# Patient Record
Sex: Female | Born: 1935 | Race: White | Hispanic: No | Smoking: Never smoker
Health system: Southern US, Community
[De-identification: ages and names within clinical notes are randomized; demographics above are authoritative.]

## PROBLEM LIST (undated history)

## (undated) DIAGNOSIS — I10 Essential (primary) hypertension: Secondary | ICD-10-CM

## (undated) DIAGNOSIS — K219 Gastro-esophageal reflux disease without esophagitis: Secondary | ICD-10-CM

## (undated) DIAGNOSIS — D649 Anemia, unspecified: Secondary | ICD-10-CM

## (undated) DIAGNOSIS — M1990 Unspecified osteoarthritis, unspecified site: Secondary | ICD-10-CM

---

## 2003-06-27 ENCOUNTER — Emergency Department (HOSPITAL_COMMUNITY): Admission: EM | Admit: 2003-06-27 | Discharge: 2003-06-27 | Payer: Self-pay | Admitting: Emergency Medicine

## 2008-06-25 ENCOUNTER — Emergency Department (HOSPITAL_BASED_OUTPATIENT_CLINIC_OR_DEPARTMENT_OTHER): Admission: EM | Admit: 2008-06-25 | Discharge: 2008-06-25 | Payer: Self-pay | Admitting: Emergency Medicine

## 2008-06-25 ENCOUNTER — Ambulatory Visit: Payer: Self-pay | Admitting: Interventional Radiology

## 2008-07-26 ENCOUNTER — Inpatient Hospital Stay (HOSPITAL_COMMUNITY): Admission: EM | Admit: 2008-07-26 | Discharge: 2008-08-01 | Payer: Self-pay | Admitting: Emergency Medicine

## 2008-07-29 ENCOUNTER — Encounter (INDEPENDENT_AMBULATORY_CARE_PROVIDER_SITE_OTHER): Payer: Self-pay | Admitting: General Surgery

## 2008-08-08 ENCOUNTER — Encounter: Payer: Self-pay | Admitting: Emergency Medicine

## 2008-08-08 ENCOUNTER — Inpatient Hospital Stay (HOSPITAL_COMMUNITY): Admission: EM | Admit: 2008-08-08 | Discharge: 2008-08-11 | Payer: Self-pay | Admitting: Internal Medicine

## 2008-08-08 ENCOUNTER — Ambulatory Visit: Payer: Self-pay | Admitting: Diagnostic Radiology

## 2008-08-10 ENCOUNTER — Ambulatory Visit: Payer: Self-pay | Admitting: Gastroenterology

## 2009-11-26 ENCOUNTER — Observation Stay (HOSPITAL_COMMUNITY): Admission: EM | Admit: 2009-11-26 | Discharge: 2009-11-28 | Payer: Self-pay | Admitting: Emergency Medicine

## 2010-06-19 LAB — DIFFERENTIAL
Basophils Absolute: 0 10*3/uL (ref 0.0–0.1)
Eosinophils Absolute: 0.3 10*3/uL (ref 0.0–0.7)
Lymphocytes Relative: 7 % — ABNORMAL LOW (ref 12–46)
Lymphs Abs: 0.8 10*3/uL (ref 0.7–4.0)
Neutrophils Relative %: 83 % — ABNORMAL HIGH (ref 43–77)

## 2010-06-19 LAB — CK TOTAL AND CKMB (NOT AT ARMC)
CK, MB: 1.5 ng/mL (ref 0.3–4.0)
CK, MB: 3.6 ng/mL (ref 0.3–4.0)
Relative Index: INVALID (ref 0.0–2.5)
Total CK: 22 U/L (ref 7–177)

## 2010-06-19 LAB — TROPONIN I: Troponin I: 0.02 ng/mL (ref 0.00–0.06)

## 2010-06-19 LAB — BASIC METABOLIC PANEL
BUN: 15 mg/dL (ref 6–23)
Calcium: 8.6 mg/dL (ref 8.4–10.5)
GFR calc non Af Amer: 58 mL/min — ABNORMAL LOW (ref >60)
Potassium: 3.7 mEq/L (ref 3.5–5.1)
Sodium: 138 mEq/L (ref 135–145)

## 2010-06-19 LAB — CARDIAC PANEL(CRET KIN+CKTOT+MB+TROPI)
CK, MB: 1.8 ng/mL (ref 0.3–4.0)
Relative Index: INVALID (ref 0.0–2.5)
Total CK: 25 U/L (ref 7–177)
Troponin I: 0.04 ng/mL (ref 0.00–0.06)

## 2010-06-19 LAB — CBC
MCH: 29.1 pg (ref 26.0–34.0)
MCHC: 32.5 g/dL (ref 30.0–36.0)
MCHC: 32.7 g/dL (ref 30.0–36.0)
MCV: 89.6 fL (ref 78.0–100.0)
Platelets: 174 10*3/uL (ref 150–400)
Platelets: 185 10*3/uL (ref 150–400)
RDW: 13.4 % (ref 11.5–15.5)
WBC: 8.7 10*3/uL (ref 4.0–10.5)

## 2010-06-19 LAB — MAGNESIUM: Magnesium: 1.9 mg/dL (ref 1.5–2.5)

## 2010-06-19 LAB — COMPREHENSIVE METABOLIC PANEL
ALT: 14 U/L (ref 0–35)
CO2: 26 mEq/L (ref 19–32)
Calcium: 8.4 mg/dL (ref 8.4–10.5)
Chloride: 105 mEq/L (ref 96–112)
Creatinine, Ser: 1.04 mg/dL (ref 0.4–1.2)
GFR calc non Af Amer: 49 mL/min — ABNORMAL LOW (ref >60)
Glucose, Bld: 114 mg/dL — ABNORMAL HIGH (ref 70–99)
Total Bilirubin: 0.9 mg/dL (ref 0.3–1.2)

## 2010-06-19 LAB — LIPASE, BLOOD: Lipase: 21 U/L (ref 11–59)

## 2010-06-19 LAB — HEMOGLOBIN A1C: Hgb A1c MFr Bld: 5.5 % (ref <5.7)

## 2010-06-19 LAB — LIPID PANEL: VLDL: 11 mg/dL (ref 0–40)

## 2010-06-19 LAB — PROTIME-INR: Prothrombin Time: 14.3 seconds (ref 11.6–15.2)

## 2010-06-19 LAB — APTT: aPTT: 43 seconds — ABNORMAL HIGH (ref 24–37)

## 2010-07-14 LAB — CBC
HCT: 33.1 % — ABNORMAL LOW (ref 36.0–46.0)
HCT: 37.2 % (ref 36.0–46.0)
Hemoglobin: 11.6 g/dL — ABNORMAL LOW (ref 12.0–15.0)
Hemoglobin: 12.7 g/dL (ref 12.0–15.0)
Hemoglobin: 12.8 g/dL (ref 12.0–15.0)
MCHC: 34.5 g/dL (ref 30.0–36.0)
MCV: 88.9 fL (ref 78.0–100.0)
Platelets: 316 10*3/uL (ref 150–400)
Platelets: 328 10*3/uL (ref 150–400)
RBC: 4.2 MIL/uL (ref 3.87–5.11)
RDW: 12.9 % (ref 11.5–15.5)
RDW: 12.9 % (ref 11.5–15.5)
RDW: 13.4 % (ref 11.5–15.5)
WBC: 5.6 10*3/uL (ref 4.0–10.5)
WBC: 9.7 10*3/uL (ref 4.0–10.5)

## 2010-07-14 LAB — CULTURE, BLOOD (ROUTINE X 2)
Culture: NO GROWTH
Culture: NO GROWTH

## 2010-07-14 LAB — COMPREHENSIVE METABOLIC PANEL
ALT: 113 U/L — ABNORMAL HIGH (ref 0–35)
ALT: 71 U/L — ABNORMAL HIGH (ref 0–35)
ALT: 61 U/L — ABNORMAL HIGH (ref 0–35)
AST: 163 U/L — ABNORMAL HIGH (ref 0–37)
Albumin: 2.8 g/dL — ABNORMAL LOW (ref 3.5–5.2)
Albumin: 3.3 g/dL — ABNORMAL LOW (ref 3.5–5.2)
Alkaline Phosphatase: 168 U/L — ABNORMAL HIGH (ref 39–117)
Alkaline Phosphatase: 206 U/L — ABNORMAL HIGH (ref 39–117)
Alkaline Phosphatase: 209 U/L — ABNORMAL HIGH (ref 39–117)
BUN: 27 mg/dL — ABNORMAL HIGH (ref 6–23)
CO2: 28 mEq/L (ref 19–32)
Calcium: 9.2 mg/dL (ref 8.4–10.5)
Chloride: 107 mEq/L (ref 96–112)
Chloride: 103 mEq/L (ref 96–112)
GFR calc Af Amer: >60 mL/min (ref >60)
Glucose, Bld: 94 mg/dL (ref 70–99)
Glucose, Bld: 102 mg/dL — ABNORMAL HIGH (ref 70–99)
Potassium: 3.6 mEq/L (ref 3.5–5.1)
Potassium: 3.8 mEq/L (ref 3.5–5.1)
Potassium: 4.5 mEq/L (ref 3.5–5.1)
Sodium: 140 mEq/L (ref 135–145)
Sodium: 141 mEq/L (ref 135–145)
Sodium: 139 mEq/L (ref 135–145)
Total Bilirubin: 0.6 mg/dL (ref 0.3–1.2)
Total Bilirubin: 0.7 mg/dL (ref 0.3–1.2)
Total Protein: 5.4 g/dL — ABNORMAL LOW (ref 6.0–8.3)
Total Protein: 6.2 g/dL (ref 6.0–8.3)

## 2010-07-14 LAB — URINALYSIS, ROUTINE W REFLEX MICROSCOPIC
Glucose, UA: NEGATIVE mg/dL
Hgb urine dipstick: NEGATIVE
Protein, ur: NEGATIVE mg/dL
pH: 5 (ref 5.0–8.0)

## 2010-07-14 LAB — URINE MICROSCOPIC-ADD ON

## 2010-07-14 LAB — URINALYSIS, MICROSCOPIC ONLY
Bilirubin Urine: NEGATIVE
Ketones, ur: NEGATIVE mg/dL
Nitrite: NEGATIVE
Protein, ur: NEGATIVE mg/dL
pH: 7.5 (ref 5.0–8.0)

## 2010-07-14 LAB — HEPATIC FUNCTION PANEL
AST: 35 U/L (ref 0–37)
Albumin: 2.8 g/dL — ABNORMAL LOW (ref 3.5–5.2)
Alkaline Phosphatase: 149 U/L — ABNORMAL HIGH (ref 39–117)
Bilirubin, Direct: <0.1 mg/dL (ref 0.0–0.3)
Total Bilirubin: 0.6 mg/dL (ref 0.3–1.2)

## 2010-07-14 LAB — DIFFERENTIAL
Basophils Absolute: 0 10*3/uL (ref 0.0–0.1)
Basophils Relative: 0 % (ref 0–1)
Eosinophils Absolute: 0.3 10*3/uL (ref 0.0–0.7)
Monocytes Absolute: 0.5 10*3/uL (ref 0.1–1.0)
Neutro Abs: 8 10*3/uL — ABNORMAL HIGH (ref 1.7–7.7)
Neutrophils Relative %: 82 % — ABNORMAL HIGH (ref 43–77)

## 2010-07-14 LAB — MAGNESIUM: Magnesium: 1.9 mg/dL (ref 1.5–2.5)

## 2010-07-14 LAB — URINE CULTURE: Culture: NO GROWTH

## 2010-07-14 LAB — LIPASE, BLOOD: Lipase: 67 U/L — ABNORMAL HIGH (ref 11–59)

## 2010-07-14 LAB — LIPID PANEL
Cholesterol: 153 mg/dL (ref 0–200)
LDL Cholesterol: 91 mg/dL (ref 0–99)

## 2010-07-14 LAB — POCT CARDIAC MARKERS: Myoglobin, poc: 86.6 ng/mL (ref 12–200)

## 2010-07-15 LAB — CBC
HCT: 34 % — ABNORMAL LOW (ref 36.0–46.0)
HCT: 37.3 % (ref 36.0–46.0)
Hemoglobin: 11.9 g/dL — ABNORMAL LOW (ref 12.0–15.0)
Hemoglobin: 12 g/dL (ref 12.0–15.0)
Hemoglobin: 12.8 g/dL (ref 12.0–15.0)
Hemoglobin: 13 g/dL (ref 12.0–15.0)
Hemoglobin: 13.6 g/dL (ref 12.0–15.0)
MCHC: 34.1 g/dL (ref 30.0–36.0)
MCHC: 34.2 g/dL (ref 30.0–36.0)
MCHC: 34.8 g/dL (ref 30.0–36.0)
MCHC: 35.1 g/dL (ref 30.0–36.0)
MCHC: 35.2 g/dL (ref 30.0–36.0)
MCV: 88.8 fL (ref 78.0–100.0)
MCV: 89.8 fL (ref 78.0–100.0)
Platelets: 222 10*3/uL (ref 150–400)
Platelets: 236 10*3/uL (ref 150–400)
RBC: 3.85 MIL/uL — ABNORMAL LOW (ref 3.87–5.11)
RBC: 4.23 MIL/uL (ref 3.87–5.11)
RDW: 13.3 % (ref 11.5–15.5)
RDW: 13.3 % (ref 11.5–15.5)
RDW: 13.4 % (ref 11.5–15.5)
RDW: 13.8 % (ref 11.5–15.5)
WBC: 7.6 10*3/uL (ref 4.0–10.5)

## 2010-07-15 LAB — COMPREHENSIVE METABOLIC PANEL
ALT: 144 U/L — ABNORMAL HIGH (ref 0–35)
ALT: 49 U/L — ABNORMAL HIGH (ref 0–35)
ALT: 55 U/L — ABNORMAL HIGH (ref 0–35)
AST: 35 U/L (ref 0–37)
AST: 87 U/L — ABNORMAL HIGH (ref 0–37)
Albumin: 2.7 g/dL — ABNORMAL LOW (ref 3.5–5.2)
Albumin: 3.5 g/dL (ref 3.5–5.2)
Alkaline Phosphatase: 115 U/L (ref 39–117)
Alkaline Phosphatase: 151 U/L — ABNORMAL HIGH (ref 39–117)
Alkaline Phosphatase: 160 U/L — ABNORMAL HIGH (ref 39–117)
BUN: 11 mg/dL (ref 6–23)
BUN: 6 mg/dL (ref 6–23)
BUN: 7 mg/dL (ref 6–23)
CO2: 28 mEq/L (ref 19–32)
CO2: 28 mEq/L (ref 19–32)
Calcium: 8.5 mg/dL (ref 8.4–10.5)
Calcium: 8.8 mg/dL (ref 8.4–10.5)
Calcium: 8.8 mg/dL (ref 8.4–10.5)
Calcium: 9 mg/dL (ref 8.4–10.5)
Chloride: 104 mEq/L (ref 96–112)
Chloride: 108 mEq/L (ref 96–112)
Creatinine, Ser: 0.75 mg/dL (ref 0.4–1.2)
Creatinine, Ser: 0.78 mg/dL (ref 0.4–1.2)
GFR calc Af Amer: >60 mL/min (ref >60)
GFR calc Af Amer: >60 mL/min (ref >60)
GFR calc non Af Amer: >60 mL/min (ref >60)
GFR calc non Af Amer: >60 mL/min (ref >60)
Glucose, Bld: 108 mg/dL — ABNORMAL HIGH (ref 70–99)
Glucose, Bld: 118 mg/dL — ABNORMAL HIGH (ref 70–99)
Glucose, Bld: 129 mg/dL — ABNORMAL HIGH (ref 70–99)
Glucose, Bld: 86 mg/dL (ref 70–99)
Glucose, Bld: 87 mg/dL (ref 70–99)
Potassium: 3.5 mEq/L (ref 3.5–5.1)
Potassium: 3.6 mEq/L (ref 3.5–5.1)
Potassium: 4 mEq/L (ref 3.5–5.1)
Sodium: 138 mEq/L (ref 135–145)
Sodium: 139 mEq/L (ref 135–145)
Sodium: 140 mEq/L (ref 135–145)
Sodium: 141 mEq/L (ref 135–145)
Total Bilirubin: 0.9 mg/dL (ref 0.3–1.2)
Total Protein: 5.5 g/dL — ABNORMAL LOW (ref 6.0–8.3)
Total Protein: 5.6 g/dL — ABNORMAL LOW (ref 6.0–8.3)
Total Protein: 6.1 g/dL (ref 6.0–8.3)
Total Protein: 6.1 g/dL (ref 6.0–8.3)

## 2010-07-15 LAB — HEPATIC FUNCTION PANEL
AST: 28 U/L (ref 0–37)
Albumin: 2.6 g/dL — ABNORMAL LOW (ref 3.5–5.2)
Alkaline Phosphatase: 116 U/L (ref 39–117)
Total Bilirubin: 1 mg/dL (ref 0.3–1.2)
Total Protein: 5.2 g/dL — ABNORMAL LOW (ref 6.0–8.3)

## 2010-07-15 LAB — BASIC METABOLIC PANEL
CO2: 23 mEq/L (ref 19–32)
Chloride: 109 mEq/L (ref 96–112)
GFR calc Af Amer: >60 mL/min (ref >60)
Potassium: 3.3 mEq/L — ABNORMAL LOW (ref 3.5–5.1)
Sodium: 139 mEq/L (ref 135–145)

## 2010-07-15 LAB — CULTURE, BLOOD (ROUTINE X 2)

## 2010-07-15 LAB — LIPID PANEL
Cholesterol: 144 mg/dL (ref 0–200)
LDL Cholesterol: 81 mg/dL (ref 0–99)
VLDL: 13 mg/dL (ref 0–40)

## 2010-07-15 LAB — POCT CARDIAC MARKERS
CKMB, poc: 1.2 ng/mL (ref 1.0–8.0)
Myoglobin, poc: 85 ng/mL (ref 12–200)

## 2010-07-15 LAB — AMYLASE: Amylase: 135 U/L — ABNORMAL HIGH (ref 27–131)

## 2010-07-15 LAB — DIFFERENTIAL
Basophils Relative: 0 % (ref 0–1)
Eosinophils Absolute: 0.2 10*3/uL (ref 0.0–0.7)
Lymphs Abs: 0.5 10*3/uL — ABNORMAL LOW (ref 0.7–4.0)
Monocytes Absolute: 0.8 10*3/uL (ref 0.1–1.0)
Monocytes Relative: 7 % (ref 3–12)
Neutrophils Relative %: 87 % — ABNORMAL HIGH (ref 43–77)

## 2010-07-15 LAB — C-REACTIVE PROTEIN: CRP: 17 mg/dL — ABNORMAL HIGH (ref <0.6)

## 2010-07-15 LAB — URINALYSIS, ROUTINE W REFLEX MICROSCOPIC
Bilirubin Urine: NEGATIVE
Ketones, ur: NEGATIVE mg/dL
Nitrite: NEGATIVE
Protein, ur: NEGATIVE mg/dL
Urobilinogen, UA: 1 mg/dL (ref 0.0–1.0)
pH: 5.5 (ref 5.0–8.0)

## 2010-07-15 LAB — ALT: ALT: 57 U/L — ABNORMAL HIGH (ref 0–35)

## 2010-07-15 LAB — GLUCOSE, CAPILLARY: Glucose-Capillary: 119 mg/dL — ABNORMAL HIGH (ref 70–99)

## 2010-07-15 LAB — PROTIME-INR
INR: 1.1 (ref 0.00–1.49)
Prothrombin Time: 14.2 seconds (ref 11.6–15.2)

## 2010-07-15 LAB — LIPASE, BLOOD
Lipase: 34 U/L (ref 11–59)
Lipase: 675 U/L — ABNORMAL HIGH (ref 11–59)

## 2010-07-15 LAB — CK TOTAL AND CKMB (NOT AT ARMC)
Relative Index: INVALID (ref 0.0–2.5)
Total CK: 36 U/L (ref 7–177)

## 2010-07-15 LAB — SEDIMENTATION RATE: Sed Rate: 30 mm/hr — ABNORMAL HIGH (ref 0–22)

## 2010-07-16 LAB — URINALYSIS, ROUTINE W REFLEX MICROSCOPIC
Nitrite: NEGATIVE
Protein, ur: NEGATIVE mg/dL
Urobilinogen, UA: 1 mg/dL (ref 0.0–1.0)

## 2010-07-16 LAB — COMPREHENSIVE METABOLIC PANEL
ALT: 28 U/L (ref 0–35)
Alkaline Phosphatase: 138 U/L — ABNORMAL HIGH (ref 39–117)
BUN: 27 mg/dL — ABNORMAL HIGH (ref 6–23)
CO2: 28 mEq/L (ref 19–32)
GFR calc non Af Amer: >60 mL/min (ref >60)
Glucose, Bld: 101 mg/dL — ABNORMAL HIGH (ref 70–99)
Potassium: 4.4 mEq/L (ref 3.5–5.1)
Sodium: 141 mEq/L (ref 135–145)

## 2010-07-16 LAB — CBC
HCT: 42.6 % (ref 36.0–46.0)
Hemoglobin: 14.1 g/dL (ref 12.0–15.0)
MCHC: 33 g/dL (ref 30.0–36.0)
RBC: 4.69 MIL/uL (ref 3.87–5.11)

## 2010-07-16 LAB — POCT CARDIAC MARKERS: Troponin i, poc: <0.05 ng/mL (ref 0.00–0.09)

## 2010-07-16 LAB — DIFFERENTIAL
Basophils Absolute: 0 10*3/uL (ref 0.0–0.1)
Basophils Relative: 1 % (ref 0–1)
Eosinophils Absolute: 0.3 10*3/uL (ref 0.0–0.7)
Neutrophils Relative %: 76 % (ref 43–77)

## 2010-07-16 LAB — URINE CULTURE: Culture: NO GROWTH

## 2010-07-16 LAB — URINE MICROSCOPIC-ADD ON

## 2010-08-18 NOTE — Discharge Summary (Signed)
Alexis Archer, STENCIL NO.:  [id number]   MEDICAL RECORD NO.:  [id number]          PATIENT TYPE:  INP   LOCATION:  1317                         FACILITY:  [facility]   PHYSICIAN:  Marcellus Scott, MD     DATE OF BIRTH:  04-Dec-1914   DATE OF ADMISSION:  08/08/2008  DATE OF DISCHARGE:  08/11/2008                               DISCHARGE SUMMARY   PRIMARY MEDICAL DOCTOR:  Dr. Karlene Einstein of [facility].   PRIMARY GASTROENTEROLOGIST:  Dr. Jeani Hawking.   DISCHARGE DIAGNOSIS:  1. Gallstone pancreatitis, status post endoscopic retrograde      cholangiopancreatography and gallstone extraction.  2. Encephalopathy/ altered mental status, resolved.  3. Urinary tract infection.  4. Anemia, stable.  5. Osteoarthritis.  6. Glaucoma.  7. Hypertension.  8. Gastroesophageal reflux disease.   DISCHARGE MEDICATIONS:  1. Os-Cal 500 mg p.o. t.i.d. with meals.  2. Ambien 5 mg p.o. q.h.s. p.r.n.  3. Calcitonin nasal spray 200 units, one spray nasally daily.  May      alternate nostrils.  4. Norvasc 5 mg p.o. daily.  5. Combigan 0.5% solution, one drop in each eye b.i.d.  6. Dulcolax 10 mg p.o. b.i.d.  7. Ativan 0.5 mg p.o.q.8h. p.r.n. for agitation.  8. Duragesic patch 12 mcg per hour transdermally q.72 hours.  9. Lumigan ophthalmic solution, 0.03%, one drop in each eye q.h.s.  10.Protonix 40 mg p.o. daily.  11.Percocet 5/325 mg  tablet, one to two tablets p.o. q.6h. p.r.n.  12.Beneprotein, one scoop p.o. t.i.d. between meals and beverage of      choice.  13.Ceftin 250 mg p.o. b.i.d. for three days.   DISCONTINUED MEDICATIONS:  1. Milk of magnesia.  2. Dulcolax suppository.  3. Fleet's enema.  4. Imodium.  5. Vicodin  6. A stress tablets with zinc.   PROCEDURE:  1. ERCP by Dr. Melvia Heaps on Aug 10, 2008.  Impression:  Lithotripsy      of common bile duct calculus with balloon extraction of the      fragments.  Good drainage on the last film.  2. CT of the  abdomen with contrast.  Impression:  Findings raise      possibility of common bile duct stone.  Post-surgical changes,      status post cholecystectomy.  3. X-rays of the abdomen.  Impression:  A tiny amount of free air      under the right hemidiaphragm, is probably related to recent      cholecystectomy and nonobstructive bowel gas pattern.   LABORATORY DATA:  hepatic panel today with total protein 5.3, albumin  2.8, AST 35, ALT 52, alkaline phosphatase 149, total bilirubin 0.6,  direct bilirubin less than 0.1, CBC with a hemoglobin of 12, hematocrit  34.7 white blood cells 6.1, platelets 328.  Urine cultures showed no  growth.  Urine culture was sent off the next day after antibiotics were  started.  Blood cultures x2 showed no growth to date.  Basic metabolic  panel yesterday was unremarkable, with BUN of 9, creatinine 0.8.  Urinalysis on admission had positive nitrites, small leukocytes,  many  bacteria and 7-10 white blood cells per high-power field.  The last  lipase was 67 and amylase 114, lipid panel unremarkable.  Lipase on  admission was 646.  Point of care cardiac markers were negative.   CONSULTATIONS:  GI, Dr. Jeani Hawking and Dr. Melvia Heaps.   HOSPITAL COURSE/DISPOSITION:  Please refer to the history and physical  note for initial admission details.  In summary, Alexis Archer is a  pleasant 77 year old Caucasian female patient, with recent laparoscopic  cholecystectomy, followed by two endoscopic retrograde  cholangiopancreatographies, osteoporosis, mild dementia, anxiety,  chronic low back pain, osteoarthritis, hypertension and gastroesophageal  reflux disease, who now represented with epigastric pain and was found  to have elevated lipase, suggestive of recurrent gallstone pancreatitis.   Problem #1 - Recurrent gallstones pancreatitis:  The patient was  admitted to the medical floor.  She was made n.p.o..  She was placed on  IV fluids.  GI was consulted.  They  repeated CT of the abdomen, which  revealed a common bile duct stone.  Subsequently the patient had an ERCP  yesterday, followed by a stone extraction.  Since then, the patient has  been asymptomatic of abdominal pain.  Her advancing diet has been  tolerated.  GI has reviewed her today and cleared her for discharge.  The patient is to follow up with Dr. Jeani Hawking as an outpatient.  The  facility is to call his office to make such appointments.   Problem #2 - Encephalopathy:  The patient has baseline mild dementia and  she was confused, which may have been secondary to her acute medical  illness of pancreatitis and urinary tract infection, and also the  perioperative medications given.  Today, however, her daughter indicates  that her mental status is back to baseline.   Problem #3 - The patient has received ceftriaxone in the hospital.  Will  complete a total week's course of antibiotics and then recommend  repeating a urinalysis.   Problem #4 - Anemia:  Which has been stable.   Problem #5 - Osteoarthritis:  The patient complains of some knee pains,  but the knees themselves have no acute findings.  This is likely  secondary to her arthritis.   The patient is a full code.   I have discussed her care with her daughter, Alexis Archer, and updated  all care.  The patient apparently is wheelchair-bound at the nursing  facility.  The patient at this time is stable for discharge, to return  to [employer], on a regular diet.   The time taken in coordinating this discharge is 45 minutes.      Marcellus Scott, MD  Electronically Signed     AH/MEDQ  D:  08/11/2008  T:  08/11/2008  Job:  [id number]   cc:   Jordan Hawks. Elnoria Howard, MD  Fax: [phone]   Karlene Einstein, M.D.  Fax: [phone]   Barbette Hair. Arlyce Dice, MD,FACG  [address]  [place]  [place] [address]

## 2010-08-18 NOTE — Consult Note (Signed)
NAMECHENEY, GOSCH NO.:  [id number]   MEDICAL RECORD NO.:  [id number]          PATIENT TYPE:  INP   LOCATION:  5159                         FACILITY:  [facility]   PHYSICIAN:  Ardeth Sportsman, MD     DATE OF BIRTH:  [birth year]   DATE OF CONSULTATION:  DATE OF DISCHARGE:                                 CONSULTATION   REASON FOR CONSULTATION:  Biliary pancreatitis.   HISTORY OF PRESENT ILLNESS:  Alexis Archer is a 89-year female patient who  resides at a skilled nursing facility.  According to her daughter, she  presented at this time to the ER with severe acute onset of an upper  abdominal pain.  The patient has been having episodic symptoms  consistent with biliary colics every 2 months and has been brought to  the ER.  This has been going on for about a year.  Only recently in the  past few months did they finally discovered the patient had gallstones  and has been trying to manage this with a low-fat diet.  Unfortunately,  the patient has continued to have abdominal pain and nausea and this  episode is the worse she has had.  On presentation, her abdomen was  quite tender in the right upper quadrant epigastric regions.  She had a  low-grade leukocytosis, elevated LFTs with an elevated alkaline  phosphatase, ALT, and mildly elevated total bilirubin.  Because of the  degree of her pain, a CT was opted for as initial study.  This did show  gallbladder wall thickening, distended gallbladder with a stone in the  gallbladder measuring almost 2 cm.  She also has a stone, 0.9 mm, wedged  in the mid common bile duct with biliary ductal dilatation.  This is  consistent with choledocholithiasis.  Internal Medicine is admitting the  patient and GI has been consulted for possible ERCP soon.   REVIEW OF SYSTEMS:  The patient unable to contribute due to short-term  memory issues.  Otherwise, as per the history of present illness.  Other  systems are negative or  noncontributory.   SOCIAL HISTORY:  She is a resident of skilled nursing facility at the  [facility].   FAMILY HISTORY:  Noncontributory.   PAST MEDICAL HISTORY:  1. Cholelithiasis and biliary colic, episodic.  2. Vertebral compression fractures with chronic back pain.  3. Degenerative joint disease.  4. Peptic ulcer disease and GERD.  5. Known hemorrhoids.  6. Recurrent UTIs.  7. Dementia and encephalopathy.   PAST SURGICAL HISTORY:  Appendectomy as a child.   ALLERGIES:  NKDA.   CURRENT MEDICATIONS AT THE NURSING FACILITY:  1. __________.  2. Ambien.  3. Atarax.  4. Calcitonin.  5. Colace.  6. Combigan.  7. Dulcolax.  8. Duragesic.  9. Lorazepam.  10.Lumigan.  11.Multiple vitamins.  12.Oyster Shell with Vitamin D.  13.Pepcid.  14.Vicodin.  15.Vitamin C.   PHYSICAL EXAMINATION:  GENERAL:  Pleasant female patient who was not  readily reporting pain until her examination.  VITAL SIGNS:  Temperature 98.5, BP 168/80, pulse 77 and regular,  respirations are 18.  PSYCH:  The patient is alert, oriented to name.  She has short-term  memory deficits.  She was unable to recall her recent history regarding  her abdominal symptoms.  She is verbal though and makes eye contact, and  is otherwise appropriate.  NEUROLOGIC:  Cranial nerves II through XII are grossly intact.  She is  moving all extremities x4 without any gross motor neurological deficits.  EYES:  Sclerae are nonicteric.  EARS, NOSE, AND THROAT:  Ears are symmetrical.  No otorrhea.  Nose is  midline.  No rhinorrhea.  Oral mucous membranes are pink and moist.  CHEST:  Bilateral lung sounds are clear to auscultation.  Respiratory  effort is nonlabored.  CARDIOVASCULAR:  Heart sounds.  S1, S2.  No rubs, murmurs, thrills.  No  gallops.  No JVD.  No peripheral edema.  EXTREMITIES:  Slightly tender to the touch bilaterally.  The daughter  says this is chronic.  Extremities are symmetrical without cyanosis or   clubbing.  ABDOMEN:  Obese.  Tender in the right upper quadrant epigastrium with  guarding.  No rebounding.  Bowel sounds are present, but diminished.  She has a well-healed scar in her right lower quadrant consistent with  prior appendectomy.  No hernia here.   LABORATORY DATA:  White count 12,000, hemoglobin 13.6, platelets  236,000, neutrophils 87%.  Sodium 141, potassium 3.5, CO2 of 25, glucose  108, BUN 22, creatinine 0.88, lipase 1926, total bilirubin 1.1, alkaline  phosphatase 160, AST 88, ALT 144.  Diagnostic CT of the abdomen and  pelvis as noted.   IMPRESSION:  1. Biliary colic and biliary pancreatitis, mild per CT report.  2. Choledocholithiasis.  GI consult pending for ERCP.  3. Dementia encephalopathy.  4. Leukocytosis.  5. Multiple medical problems, otherwise followed by Internal Medicine.   PLAN:  1. Agree with GI evaluation and ERCP when available.  2. Await for pancreatitis to resolve and cool down.  We will follow      enzymes serially.  Plan on probable laparoscopic cholecystectomy      this admission.  3. If ERCP unsuccessful and the patient remains with obstructive      problems related to the gallbladder and/or for whatever reason may      cannot proceed with surgical intervention this admission, may need      to consider percutaneous cholecystostomy tube.  Again we will      follow labs serially.  We will keep her n.p.o.      Allison L. Rolene Course      Ardeth Sportsman, MD  Electronically Signed    ALE/MEDQ  D:  07/26/2008  T:  07/27/2008  Job:  [id number]   cc:   Dr. Janice Norrie

## 2010-08-18 NOTE — H&P (Signed)
NAMEPARIS, HOHN NO.:  [id number]   MEDICAL RECORD NO.:  [id number]          PATIENT TYPE:  INP   LOCATION:  1317                         FACILITY:  [facility]   PHYSICIAN:  Manus Gunning, MD      DATE OF BIRTH:  November 26, 1914   DATE OF ADMISSION:  08/08/2008  DATE OF DISCHARGE:  08/01/2008                              HISTORY & PHYSICAL   CHIEF COMPLAINT:  Abdominal pain.   HISTORY OF PRESENT ILLNESS:  Mrs. Dondlinger is a 77 year old skilled  nursing facility resident of [facility].  She presented to  [facility] ED with complaints of abdominal/epigastric pain.  At that  time her lipase was elevated at 646. She was subsequently transferred to  our facility for pancreatitis.  The ED physician had discussed her case  with Dr. Loreta Ave who was covering for Dr. Elnoria Howard.  Apparently she had  undergone an ERCP and laparoscopic cholecystectomy for gallstone  pancreatitis on the July 31, 2008 and was discharged from [facility]  on April 29 in a stable and pain free condition.  Dr. Loreta Ave  recommended that the patient be admitted and kept n.p.o. for possible  redo ERCP tomorrow.  The patient herself is mildly demented.  She is  confused about where she is and about her previous admission, but she is  aware of what day it is and is otherwise very pleasant to speak to and  able to carry on normal conversation.  Unfortunately, in response to her  medical history most of her answers are I don't know.  She does  complain of epigastric discomfort, sharp in nature, 6/10.  No nausea, no  vomiting.  Tolerating p.o. intake with a normal appetite at home.  She  claims that the pain does occasionally travel to her back.  Denies any  diarrhea, constipation, dysuria or polyuria.  No hematuria or bright red  blood per rectum or melenic stools.  Of note, urinalysis at the ED  demonstrated urinary tract infection though she is afebrile with no  leukocytosis.  She denies headaches,  palpitations, PND, orthopnea, chest  pain, no syncope, presyncope, lightheadedness.  Has chronic pain  secondary to degenerative disk disease and osteoarthritis.  She is on  multiple pain medications and is essentially wheelchair bound.  She has  no shortness of breath, cough, expectoration, no odynophagia, dysphagia,  tinnitus, loss of consciousness, or blurring of vision.   PAST MEDICAL/SURGICAL HISTORY:  1. Gallstone pancreatitis, status post ERCP and sphincterectomy and      status post laparoscopic cholecystectomy.  2. Osteoporosis.  3. Chronic lower back pain secondary to compression fractures.  4. Osteoarthritis.  5. Anxiety.  6. Glaucoma.  7. Hypertension.  8. GERD.   SOCIAL HISTORY:  Lives in a skilled nursing facility.  No history of  tobacco, illicit drugs or alcohol.  Daughter is health care power-of-  attorney by the name of [name], phone number [phone].   FAMILY HISTORY:  Unable to be obtained from the patient secondary to  current condition.  Her answer, I don't know.   HOME MEDICATIONS:  1. Beneprotein two scoops twice a day.  2. Ambien 5 mg at bedtime.  3. Atarax 10 mg q.6h.  4. Calcitonin frequency and dose unknown.  5. Colace 100 mg twice a day.  6. Combigan to both eyes one drop twice a day.  7. Dulcolax as needed.  8. Duragesic 12.5 mcg q.72h.  9. Lumigan both eyes at bedtime.  10.Multivitamin one tablet daily.  11.Oyster shell plus D1 daily .  12.Pepcid 20 mg once daily.  13.Vicodin 5/500 mg every four hours.  14.Vitamin C 500 mg twice a day.   REVIEW OF SYSTEMS:  Essentially 14-point performed.  Pertinent positives  and negatives as described above.   PHYSICAL EXAMINATION:  VITAL SIGNS:  Temperature 97.9, heart rate 86,  respiratory rate 24, blood pressure 147/77. Oxygen saturation 94% on  room air.  GENERAL:  Well-nourished, well-developed, Caucasian female, elderly lady  lying in bed in no apparent distress.  HEENT:  Normocephalic,  atraumatic.  Moist oral mucosa.  No thrush or  erythema.  No post nasal drip.  Eyes anicteric.  Extraocular muscles  intact.  Pupils equal, round and reactive to light and accommodation.  NECK:  Supple.  Good range of motion.  No thyromegaly.  No carotid  bruits.  Neck veins appear to be normal.  CARDIOVASCULAR:  S1 and S2 normal.  Regular rate and rhythm.  No  murmurs, rubs or gallops.  RESPIRATORY:  Air entry bilaterally equal.  No rhonchi, rales or wheezes  appreciated.  ABDOMEN:  Positive epigastric tenderness.  No flank discoloration.  No  Cullens or Turner's sign.  Positive bowel sounds. No organomegaly  appreciated.  Nondistended.  EXTREMITIES:  No cyanosis, clubbing or edema.  Positive bilateral  dorsalis pedis.  Trace swelling in bilateral lower extremities secondary  to chronic OA and tender to touch which is chronic as well.  CNS:  Alert, oriented to person, disoriented to time and place, though  pleasant to speak to and able to carry on normal conversation.  Power,  sensation, reflexes bilaterally symmetric.  SKIN:  No breakdown, swelling, ulcerations or masses.  HEM/ONC:  No palpable lymphadenopathy. No ecchymosis.  Mild bruising.  No petechiae.   LABORATORY DATA:  WBC 9700, hemoglobin 12.8, hematocrit 37.2, platelet  count 370,000, polymorphs 139, potassium 4.5, chloride 103, carbon  dioxide 28, glucose 102, BUN 27, creatinine 0.9, calcium 9.3, total  protein 6.9, albumin 3.7, AST 115, ALT 61, total protein 6.9, albumin  3.7, calcium 9.3.  Lipase 646.  Troponin-I less than 0.05.  CK-MB 1,  myoglobin 86.6.  Urinalysis demonstrates positive nitrites, small  leukocyte esterase, wbc's 7-10, bacteria many.   ASSESSMENT/PLAN:  1. Pancreatitis.  Make the patient n.p.o.  Start normal saline 125      ml/hour and for pain control morphine 2 mg IV q.3h. p.r.n. pain.      Continue Duragesic patch.  Gastrointestinal has been consulted by      the emergency department.  Plan for  ERCP in the morning.  Will      check a CT scan of the abdomen.  2. Urinary tract infection.  Start Rocephin 1 g IV q.24h. Check urine      culture, sensitivity and blood cultures x2.  3. Chronic pain secondary to degenerative disk disease and compression      fractures as well as osteoarthritis.  Pain control as described      above.  4. Gastrointestinal and deep venous thrombosis prophylaxis.      Sequential compression  devices and Protonix 40      mg IV q.12h.   __________      Manus Gunning, MD  Electronically Signed     SP/MEDQ  D:  08/08/2008  T:  08/09/2008  Job:  [id number]

## 2010-08-18 NOTE — Op Note (Signed)
Alexis Archer, MARET NO.:  [id number]   MEDICAL RECORD NO.:  [id number]          PATIENT TYPE:  INP   LOCATION:  2604                         FACILITY:  [facility]   PHYSICIAN:  Alexis Archer, MDDATE OF BIRTH:  [birth year]   DATE OF PROCEDURE:  07/29/2008  DATE OF DISCHARGE:                               OPERATIVE REPORT   PREOPERATIVE DIAGNOSES:  1. Biliary pancreatitis.  2. Symptomatic cholelithiasis.   POSTOP DIAGNOSES:  1. Biliary pancreatitis.  2. Symptomatic cholelithiasis.   PROCEDURE:  Laparoscopic cholecystectomy and cholangiogram.   SURGEON:  Alexis Gosling, MD   ASSISTANT:  Alexis Sportsman, MD   ANESTHESIA:  General.   FINDINGS:  A retained 8-mm stone.   SPECIMENS:  Gallbladder and contents to pathology.   ESTIMATED BLOOD LOSS:  Minimal.   COMPLICATIONS:  None.   DRAINS:  None.   DISPOSITION:  To recovery room in stable condition.  Final disposition  is she is going to need an ERCP by Dr. Jeani Archer, which I have  discussed with him already.   INDICATIONS:  [name] is a 77 year old female with a past medical  history of hypertension and GERD who was admitted to the hospital over  the last several days with abdominal pain.  She has had this pain in the  past.  This time she was noted to have pancreatitis as well as a CT scan  and ultrasound showing a dilated common bile duct as well as  cholelithiasis and a stone present in common bile duct.  She underwent  an ERCP by Dr. Elnoria Archer on July 27, 2008, with a stone that was found was  crushed and removed.  A sphincterotomy was performed.  She was then  brought for a laparoscopic cholecystectomy.  I discussed this with her  but I also discussed with her daughter, Alexis Archer, the risks,  benefits, and indications of surgery, and she voiced her understanding.   DESCRIPTION OF PROCEDURE:  After informed consent was obtained from the  patient's daughter, she was taken to the  operating room.  She was  administered 1 g of intravenous cefoxitin.  Sequential compression  devices were placed on her lower extremity prior to operation.  She was  placed under general anesthesia without complication.  Her abdomen was  then prepped with ChloraPrep, 3 minutes were allowed to pass.  She was  then draped in standard sterile surgical fashion.  Surgical time-out was  then performed.   A 10-mm vertical incision was then made below her umbilicus and  dissection was carried out down to the level of fascia.  This was  incised sharply.  Peritoneum was entered bluntly.  A 0 Vicryl  pursestring suture was placed around the fascia.  A Hasson trocar was  then introduced.  The abdomen was insufflated to 15 mmHg pressure.  She  tolerated this well.  She was then placed in the reverse Trendelenburg  position.  Three further 5-mm trocars were placed in the epigastrium and  right side of the abdomen under direct vision without complication after  infiltration with local anesthetic.  The gallbladder was then retracted  cephalad and lateral.  The triangle of Calot was dissected and a  critical view of safety was obtained.  Her cystic duct and cystic artery  were clearly identified.  Due to her history of gallstone pancreatitis,  I placed a clip on the distal portion of the cystic duct. I introduced a  Cook catheter into the abdomen and then made a ductotomy  with scissors.  I then introduced the [facility] catheter into the duct and clipped this into  position.  Following this, the saline flowed well.  We then did a  cholangiogram that showed our position to be in the cystic duct, normal  filling of both sides of the liver, flow into the duodenum, but she did  appear to have about an 8-mm retained stone that we were unable to flush  out at that point.  The catheter was then removed.  Three clips were  placed on the duct.  The artery was similarly clipped and divided.  The  gallbladder was  removed from the liver bed without complication.  There  was some bleeding in the liver that was controlled with electrocautery.  Following this, irrigation was performed until this was clear.  Hemostasis was observed.  The umbilical port site was then tied down.  I  did put an additional figure-of-eight 0 Vicryl suture into this due to  her fascia being very thin.  This was completely closed.  Upon  completion, there was some bleeding from her epigastric port that was  controlled with electrocautery.  Prior to this, I did place an Endoloop  on the cystic duct stump in anticipation of an ERCP as well.  All the  ports were removed and after the abdomen was desufflated, these were  then sutured with 4-0 Monocryl in a subcuticular fashion.  Dermabond was  placed over the wounds.  She tolerated this well, was extubated in the  operating room, and transferred to recovery room in a stable condition.  I notified Dr. Elnoria Archer that she did have a retained stone.      Alexis Gosling, MD  Electronically Signed     MCW/MEDQ  D:  07/29/2008  T:  07/30/2008  Job:  [id number]   cc:   Alexis Archer. Alexis Howard, MD  Alexis Ramus, MD  Alexis Archer, M.D.

## 2010-08-18 NOTE — Discharge Summary (Signed)
[name], [name] NO.:  [id number]   MEDICAL RECORD NO.:  [id number]          PATIENT TYPE:  INP   LOCATION:  5025                         FACILITY:  [facility]   PHYSICIAN:  Monte Fantasia, MD  DATE OF BIRTH:  [birth year]   DATE OF ADMISSION:  07/26/2008  DATE OF DISCHARGE:  08/01/2008                               DISCHARGE SUMMARY   DISCHARGE DIAGNOSIS:  1. Gallstone pancreatitis.  2. Status post laparoscopic cholecystectomy.  3. Status post endoscopic retrograde cholangiopancreatography with      balloon passage.  4. Osteoporosis.   MEDICATIONS UPON DISCHARGE:  Alphagan 1 drop each eye b.i.d.,  Timolol 1 drop each eye b.i.d.,  Ambien 5 mg p.o. q.h.s. p.r.n. insomnia,  calcitonin nasal spray 200 units 1 spray nasally daily,  Norvasc 5 mg p.o. daily,  Combigen ophthalmic eye drops 0.55 one drops each eye b.i.d.,  Dulcolax 10 mg p.o. b.i.d., Ativan 0.5 mg p.o. q.8 h. p.r.n. agitation,  Duragesic patch 12.5 mcg to the chest wall q. 72 hours,  Lumigan eye drops 0.03% one drop each eye q.h.s.,  Protonix 40 mg p.o. daily.   PROCEDURES:  1. ERCP with dilatation done by Dr. Elnoria Howard on July 31, 2008.  2. Laparoscopic cholecystectomy done by Dr. Emelia Loron on July 29, 2008.   CONSULTATIONS:  1. Surgical consult with Dr. Emelia Loron.  2. GI consult with Dr. Elnoria Howard.   HOSPITAL COURSE:  Alexis Archer is a 77 year old Caucasian lady.  The patient was admitted on July 26, 2008 with complaints of  intermittent abdominal pain secondary to cholecystitis and gallstones  for months.  The patient on admission was found to have high amylase and  lipase with deranged LFTs.  CT scan of the abdomen and pelvis showed  choledocholithiasis with evidence of chronic ductal dilatation and  gallbladder wall thickening.  The patient was evaluated by GI and  surgery at the same time on admission.  The patient was scheduled for  laparoscopic cholecystectomy and  underwent a lap cholecystectomy on  July 29, 2008.  The patient was also found to have mild pancreatitis  and hence was kept n.p.o. for the same.  The patient recovered well.  However, the patient was scheduled for ERCP on and underwent an ERCP on  July 27, 2008 with Dr. Jeani Hawking.  The patient underwent ERCP with  sphincterotomy and removal of the gallbladder stones.  The patient  recovered well through the stay in the hospital and the patient was then  scheduled for a laparoscopic cholecystectomy on April 26 and underwent a  laparoscopic cholecystectomy on April 26 as evaluated by surgery.  The  patient again underwent repeat ERCP with balloon passage on July 31, 2008 which showed a filling defect and routine postop care was  recommended as per GI.  As per GI follow-up today and the surgical  followup, the patient is medically stable to be discharged and can be  discharged to the skilled nursing facility.  The patient has been  tolerating regular diet and is medically stable to be discharged today.  RADIOLOGICAL INVESTIGATIONS:  1. Chest x-ray done on July 26, 2008.  Impression:  Low-volume chest      with bibasilar atelectasis, left base air space disease consistent      with aspiration or infection, lucency at the right hemidiaphragm      potentially could represent free air.  2. CT abdomen and pelvis with contrast done on July 26, 2008.      Impression:  A 9 mm filling defect within the common bile duct      likely represents choledocholithiasis.  There is evidence for acute      on chronic biliary dilatation, to consider the ERCP, mild      gallbladder wall thickening, gallbladder distention,      cholelithiasis, recommend clinical correlation with acute      cholecystitis, extensive diverticulosis without evidence of      diverticulitis, opacities at lung bases as described above.  3. CT pelvis.  Impression:  No acute findings in the pelvis, chronic      compression  fractures of the lumbar spine.  4. Ultrasound of the abdomen done on July 26, 2008.  Impression:      Choledocholithiasis with common bile duct dilatation and      intrahepatic biliary dilatation suggests obstruction, distended      gallbladder, cholelithiasis, mildly distended wall, and positive      sonographic Murphy's sign consistent with acute cholecystitis.  5. ERCP with sphincterotomy done on July 27, 2008.  Impression focal      filling defect which may represent a stone on the first image,      gallbladder not opacified.  6. Intraoperative cholangiogram done on July 29, 2008.  Large common      bile duct stone with dilated intra and extrahepatic ducts, distal      common bile duct stricture versus spasm on the sphincter Oddi.  7. ERCP done on July 31, 2008.  Impression:  Fluoroscopy provided for      ERCP with sphincterotomy and common bile duct stone extraction.   LABORATORY DATA:  Total WBC 7.61, hemoglobin 12.0, hematocrit 34.0,  platelet count of 257.  Sodium 140, potassium 3.6, chloride 104, bicarb  28, glucose 129, BUN 8, creatinine 0.72, total bilirubin 0.7, alkaline  phosphatase 151, AST 35, ALT 49, total protein 5.5, albumin 2.5, calcium  of 8.5, lipase 34 which has improved from 675.  Total cholesterol 144,  triglycerides 66, HDL 50, LDL 81, VLDL 13.  C-reactive protein 17.0 done  on July 27, 2008.   On examination today. temperature of 98.8, pulse of 51, respirations 18,  blood pressure 154/79.  HEENT:  Neck is supple.  Pupils equal, reacting to light.  No pallor.  No lymphadenopathy.  RESPIRATORY:  Air entry is bilaterally equal.  No rales.  No rhonchi.  CARDIOVASCULAR:  S1, S2, regular rate and rhythm.  ABDOMEN:  Soft.  No guarding.  No rigidity.  No tenderness.  EXTREMITIES: No of edema feet.  CNS: The patient is alert, awake, oriented times one, to her name,  moving all her extremities.  SKIN:  Intact.  No evidence of any rashes.   DISPOSITION:  The  patient at present is medically stable to be  discharged, is tolerating full diet, denies any complaints of pain in  her abdomen, also recovered well with her pancreatitis and had an  uneventful postoperative recovery.  The patient is medically stable to  be discharged and will be discharged to the skilled nursing facility.  Monte Fantasia, MD  Electronically Signed     MP/MEDQ  D:  08/01/2008  T:  08/01/2008  Job:  [id number]

## 2010-08-18 NOTE — Consult Note (Signed)
NAMEMADAI, NUCCIO NO.:  [id number]   MEDICAL RECORD NO.:  [id number]          PATIENT TYPE:  INP   LOCATION:  5159                         FACILITY:  [facility]   PHYSICIAN:  Jordan Hawks. Elnoria Howard, MD    DATE OF BIRTH:  Aug 07, 1914   DATE OF CONSULTATION:  DATE OF DISCHARGE:                                 CONSULTATION   REASON FOR CONSULTATION:  Choledocholithiasis and biliary pancreatitis.   This is an unassigned patient from [facility].   HISTORY OF PRESENT ILLNESS:  This is a 77 year old female with past  medical history of of hypertension and gastroesophageal reflux disease  who is admitted to the hospital with complaints of abdominal pain.  She  is unable to provide a clear history and the history has been mostly  obtained from the chart.  Apparently, the patient had abdominal pain  acutely and she had some other types of pain in the past.  With the  second abdominal pain, she was noted to have a marked elevation in her  lipase in 1900 range.  A CT scan does reveal 1 cm stone in the  gallbladder and also a dilated CBD at 1.6 cm, as well as a 9-mm stone in  the CBD.  Because of these type of findings, the patient was admitted to  the hospital for further evaluation and treatment.  At this time, the  patient feels well without any significant complaints of pain currently.   PAST MEDICAL HISTORY AND PAST SURGICAL HISTORY:  As stated above.   FAMILY HISTORY:  Noncontributory.   SOCIAL HISTORY:  Negative for alcohol, tobacco, illicit drug use.   REVIEW OF SYSTEMS:  As stated above in history of present illness,  otherwise negative.   ALLERGIES:  No known drug allergies.   MEDICATIONS:  Aspirin 325 mg p.o., bisacodyl 10 mg p.r.n. daily, Lovenox  40 mg subcu daily, morphine sulfate 2 mg p.r.n., Protonix 40 mg IV  daily, Zosyn 3.375 g IV daily, vancomycin 1 g IV daily, Ativan 0.5 mg to  1 mg IV q.6 h p.r.n., Zofran 4 mg IV q.6 h p.r.n.   ALLERGIES:  No  known drug allergies.   PHYSICAL EXAMINATION:  VITAL SIGNS:  Blood pressure is 143/77, heart  rate is 62, respirations 18, temperature is 97.4.  GENERAL:  The patient is in no acute distress, alert, and oriented.  HEENT:  Normocephalic, atraumatic.  Extraocular muscles intact.  NECK:  Supple.  No lymphadenopathy.  LUNGS:  Clear to auscultation bilaterally.  CARDIOVASCULAR:  Regular rate and rhythm.  ABDOMEN:  Flat, soft, tender in the epigastrium.  No rebound, rigidity.  Positive bowel sounds.  EXTREMITIES:  No clubbing, cyanosis, or edema.   LABORATORY DATA:  White blood cell count 12.0, hemoglobin 13.6, MCV is  89.4, platelet is at 236.  PT is 14.2, INR 1.1.  Sodium 141, potassium  2.5, chloride 106, CO2 of 25, glucose 108, BUN 22, creatinine 0.8, total  bilirubin 1.1, alk phos 160, AST 87, ALT 44, albumin is 3.5.   IMPRESSION:  1. Biliary pancreatitis.  2. Cholelithiasis.   At  this time, the patient will require an ERCP for extraction of the CBD  stones as well to relieve the situation the patient is, stable at this  time.  The patient did have marked elevation in her lipase in 1900  range; however, the CT scan does not remark on any significant  pancreatitis.  She is currently stable and receiving antibiotics.   PLAN:  1. ERCP tomorrow.  2. Follow liver panel.  Further recommendations pending.      Jordan Hawks Elnoria Howard, MD  Electronically Signed     PDH/MEDQ  D:  07/26/2008  T:  07/27/2008  Job:  [id number]

## 2010-08-18 NOTE — H&P (Signed)
NAMETIFFINIE, CAILLIER NO.:  [id number]   MEDICAL RECORD NO.:  [id number]          PATIENT TYPE:  INP   LOCATION:  5159                         FACILITY:  [facility]   PHYSICIAN:  Renee Ramus, MD       DATE OF BIRTH:  1914/08/04   DATE OF ADMISSION:  07/26/2008  DATE OF DISCHARGE:                              HISTORY & PHYSICAL   HISTORY OF PRESENT ILLNESS:  The patient is a 77 year old female who has  had intermittent abdominal pain secondary to cholecystitis and  gallstones for months.  The patient had acute episode of severe  abdominal pain this a.m., was brought to emergency department and she  was found to be obstructed in the biliary tree and she had evidence of  pancreatitis upon labs leading to diagnosis of gallstone pancreatitis.  The patient reports the pain comes always especially after meals.  Denies fevers, chills, night sweats, nausea, vomiting, chest pain,  shortness breath, PND, or orthopnea.  The patient is now comfortable.  GI has been consulted as well as Surgery.   PAST MEDICAL HISTORY:  1. Gallstones.  2. Osteoporosis.  3. History of chronic back pain from compression fractures.  4. Anxiety.  5. Glaucoma.  6. Hypertension.  7. Gastroesophageal reflux disease.   SOCIAL HISTORY:  No tobacco or alcohol use.  The patient lives in  assisted living facility.   FAMILY HISTORY:  Not available.   REVIEW OF SYSTEMS:  All other comprehensive review of systems are  negative.   MEDICATIONS:  The patient has no known drug allergies.   CURRENT MEDICATIONS:  1. Ambien 5 mg p.o. nightly p.r.n. insomnia.  2. Atarax 10 mg p.o. q.6 h. p.r.n. pruritus.  3. Calcitonin nasal spray 200 units per naris daily.  4. Norvasc 5 mg p.o. daily.  5. Combigan ophthalmic solution 0.2/0.5 one drop OU b.i.d.  6. Dulcolax 10 mg p.o. b.i.d.  7. Ativan 0.5 mg p.o. q.8 h. p.r.n. anxiety.  8. Duragesic patch 12.5 mcg apply q.72 h.  9. Lumigan 0.03% 1 drop OU nightly.  10.Vicodin 5/500 one p.o. q.4-6 hours p.r.n. pain.  11.Protonix 40 mg p.o. daily.   PHYSICAL EXAMINATION:  GENERAL:  She is a well-developed, well-nourished  white female, currently in no apparent distress.  VITAL SIGNS:  Blood pressure 140/70, heart rate 87, respiratory rate 18,  and temperature 98.5.  HEENT:  No jugular venous distention or lymphadenopathy.  Oropharynx is  clear.  Mucous membranes are pink and moist.  TMs are clear bilaterally.  Pupils are equal and reactive to light and accommodation.  Extraocular  muscles are intact.  CARDIOVASCULAR:  She has a regular rate and rhythm without murmurs,  rubs, or gallops.  PULMONARY:  Lungs are clear to auscultation bilaterally.  ABDOMEN:  Soft, tender to palpation along her right upper quadrant, but  no evidence of acute abdomen.  She has no rigidity, no rebound or  guarding.  Bowel sounds are present.  EXTREMITIES:  No clubbing, cyanosis, or edema.  She has good peripheral  pulses in dorsalis pedis and radial arteries.  She is able to move  all  extremities.  NEURO:  Cranial nerves II-XII are grossly intact.  She has no focal  neurological deficits.   STUDIES:  1. CT scan of the abdomen and pelvis shows choledocholithiasis with      evidence of chronic ductal dilatation and gallbladder wall      thickening as well as intra and extrahepatic biliary dilatation.      She also has evidence of diverticulosis and previous compression      fracture.  2. Chest x-ray shows bibasilar atelectasis.  3. EKG shows normal sinus rhythm with left axis deviation, left      anterior fascicular block, and right bundle-branch block as well as      evidence of left ventricular hypertrophy and a mild first-degree AV      block.   LABORATORY DATA:  White count 12, H&H 13 and 39, MCV 89, and platelets  236.  Sodium 141, potassium 3.5, chloride 106, bicarb 25, BUN 22,  creatinine 0.9, glucose 108, lipase of 1926, alk phos 160, AST 87, and  ALT 144.   UA shows no signs of infection and no signs of dehydration.   ASSESSMENT/PLAN:  1. Gallstone pancreatitis.  I have discussed with a Gastrointestinal      emergency room physician as coordinated with Surgery.  The patient      will be seen by GI later today for evaluation of possible      sphincterotomy more than likely she will require this to remove      blockage prior to cholecystectomy, which she will need to have done      prior to discharge.  The patient will be made n.p.o.  She will      receive pain meds and we will recheck her labs and enzymes in the      a.m.  Her current Ranson score indicates this is a mild      pancreatitis.  She has no evidence of jaundice or elevated      bilirubin.  I am hopeful that she will make a good recovery from      this and will be cured by her cholecystectomy.  2. Osteoporosis.  We will continue bisphosphonate at discharge, but      will hold all p.o. medications.  3. Osteoarthritis and history of compression fractures with chronic      pain.  We will continue pain meds IV, but hold all p.o.  4. Anxiety.  Currently stable.  Continue Ativan on a p.r.n. basis.  5. Gastroesophageal reflux disease.  We will continue proton pump      inhibitor.  6. Hypertension.  We will give metoprolol and hold Norvasc at this      time.  7. Disposition.  The patient is full code.  H and P was constructed by      reviewing past medical history, conferring with emergency medical      room physician, and reviewing the emergency medical record.   Time spent 1 hour.      Renee Ramus, MD  Electronically Signed     JF/MEDQ  D:  07/26/2008  T:  07/26/2008  Job:  [id number]   cc:   Karlene Einstein, M.D.

## 2010-08-18 NOTE — Consult Note (Signed)
NAMECORDA, SHUTT NO.:  [id number]   MEDICAL RECORD NO.:  [id number]          PATIENT TYPE:  INP   LOCATION:  1317                         FACILITY:  [facility]   PHYSICIAN:  Jordan Hawks. Elnoria Howard, MD    DATE OF BIRTH:  1914-08-03   DATE OF CONSULTATION:  08/09/2008  DATE OF DISCHARGE:                                 CONSULTATION   REASON FOR CONSULTATION:  Possible gallstone pancreatitis.   HISTORY OF PRESENT ILLNESS:  This is a 77 year old female who is status  post laparoscopic cholecystectomy for gallstone pancreatitis and status  post two ERCPs who is osteoporosis, mild dementia, anxiety, chronic low  back pain, osteoarthritis, hypertension and GERD who was admitted to the  hospital with complaints of epigastric pain, and the patient was noted  to have an elevation in her lipase at 646, and subsequently she was  admitted to the hospital.  The patient had previously been hospitalized  for gallstone pancreatitis.  At that time, she was noted to have a large  CBD stone.  The initial ERCP performed was felt to be successful in  regards to removal of the stone, and from the CBD she underwent a lap.  The procedure was difficult in that she had a periampullary diverticula  which made it a bit more difficult to create sphincterotomy.  However,  at the time of the procedure an adequate sphincterotomy at about 1.1-1.5  cm was created.  She underwent laparoscopic cholecystectomy, and during  intraoperative cholangiogram there appeared to be a filling defect  consistent with a stone.  She subsequently underwent a repeat ERCP, and  during that procedure there was no evidence of stones and it was felt  that that filling defect which was noted during the second ERCP was an  air bubble.  The occlusion cholangiogram was negative for any retained  stones, and at that point the procedure was terminated.  She was  subsequently discharged home.  Her transaminases had improved, and no  further treatment was necessary.   PAST MEDICAL AND SURGICAL HISTORY:  As stated above.   FAMILY HISTORY:  Noncontributory.   SOCIAL HISTORY:  Negative for alcohol, tobacco, illicit drug use.   REVIEW OF SYSTEMS:  Unable to adequately obtain given her mild dementia.   ALLERGIES:  NO KNOWN DRUG ALLERGIES.   MEDICATIONS:  1. Alphagan one drop in the right eye twice b.i.d.  2. Rocephin 1 gram IV q.24 hours.  3. Fentanyl 12 mcg transdermal patch every 72 hours.  4. Protonix 40 mg IV q.12 h  5. Timoptic one drop in the right eye b.i.d.  6. Ativan 0.5 mg p.o. q.12 h.  7. Morphine sulfate 100 mg IV q.3 h p.r.n.  8. Zofran 4 mg IV q.6 h p.r.n.   ALLERGIES:  NO KNOWN DRUG ALLERGIES.   PHYSICAL EXAMINATION:  VITAL SIGNS:  Blood pressure is 130/65, heart  rate is 75, respirations 20, temperature is 98.1.  GENERAL:  The patient is in no acute distress, alert to questioning.  HEENT:  Normocephalic, atraumatic.  Extraocular muscles intact.  NECK:  Supple.  No lymphadenopathy.  LUNGS:  Clear to auscultation bilaterally.  CARDIOVASCULAR:  Regular rate and rhythm.  ABDOMEN:  Tender in the epigastric region.  No rebound or rigidity.  Positive bowel sounds.  EXTREMITIES:  No clubbing, cyanosis or edema.   LABORATORY VALUES:  White blood cell count 7.2, hemoglobin 12.7, MCV  89.0, platelets at 324.  Sodium is 141, potassium 3.8, chloride 109, CO2  20.94, BUN 21, creatinine 0.7, total bilirubin 0.7, alk phos 0.6, AST  163, ALT 113, albumin is 3.3.   IMPRESSION:  1. Questionable gallstone pancreatitis, abnormal liver enzymes.  I did      review the CT scan with Radiology.  There is no clear evidence that      there is a stone.  However, given the elevation of the      transaminases, this is a possibility.  At this time, she appears to      be stable.   PLAN:  1  Plan is for the patient to be advanced a clear liquid diet.  1. I will discuss the case with Dr. Arlyce Dice who is on call this  weekend      in order for his input.  She may still require an ERCP versus an      MRCP or endoscopic ultrasound.      Jordan Hawks Elnoria Howard, MD  Electronically Signed     PDH/MEDQ  D:  08/09/2008  T:  08/09/2008  Job:  [id number]

## 2011-03-26 ENCOUNTER — Emergency Department (HOSPITAL_COMMUNITY)
Admission: EM | Admit: 2011-03-26 | Discharge: 2011-03-27 | Disposition: A | Discharge status: Skilled Nursing Facility | Payer: Medicare Other | Attending: Emergency Medicine | Admitting: Emergency Medicine

## 2011-03-26 ENCOUNTER — Emergency Department (HOSPITAL_COMMUNITY): Payer: Medicare Other

## 2011-03-26 ENCOUNTER — Encounter: Payer: Self-pay | Admitting: Emergency Medicine

## 2011-03-26 ENCOUNTER — Other Ambulatory Visit: Payer: Self-pay

## 2011-03-26 DIAGNOSIS — F603 Borderline personality disorder: Secondary | ICD-10-CM | POA: Insufficient documentation

## 2011-03-26 DIAGNOSIS — S0083XA Contusion of other part of head, initial encounter: Secondary | ICD-10-CM

## 2011-03-26 DIAGNOSIS — S52609A Unspecified fracture of lower end of unspecified ulna, initial encounter for closed fracture: Secondary | ICD-10-CM

## 2011-03-26 DIAGNOSIS — Y9210 Unspecified residential institution as the place of occurrence of the external cause: Secondary | ICD-10-CM | POA: Insufficient documentation

## 2011-03-26 DIAGNOSIS — W19XXXA Unspecified fall, initial encounter: Secondary | ICD-10-CM | POA: Insufficient documentation

## 2011-03-26 DIAGNOSIS — M25539 Pain in unspecified wrist: Secondary | ICD-10-CM | POA: Insufficient documentation

## 2011-03-26 DIAGNOSIS — Z7982 Long term (current) use of aspirin: Secondary | ICD-10-CM | POA: Insufficient documentation

## 2011-03-26 DIAGNOSIS — M1990 Unspecified osteoarthritis, unspecified site: Secondary | ICD-10-CM | POA: Insufficient documentation

## 2011-03-26 DIAGNOSIS — S1093XA Contusion of unspecified part of neck, initial encounter: Secondary | ICD-10-CM | POA: Insufficient documentation

## 2011-03-26 DIAGNOSIS — H409 Unspecified glaucoma: Secondary | ICD-10-CM | POA: Insufficient documentation

## 2011-03-26 DIAGNOSIS — S60219A Contusion of unspecified wrist, initial encounter: Secondary | ICD-10-CM | POA: Insufficient documentation

## 2011-03-26 DIAGNOSIS — S0003XA Contusion of scalp, initial encounter: Secondary | ICD-10-CM | POA: Insufficient documentation

## 2011-03-26 DIAGNOSIS — S0990XA Unspecified injury of head, initial encounter: Secondary | ICD-10-CM | POA: Insufficient documentation

## 2011-03-26 DIAGNOSIS — M25439 Effusion, unspecified wrist: Secondary | ICD-10-CM | POA: Insufficient documentation

## 2011-03-26 DIAGNOSIS — K219 Gastro-esophageal reflux disease without esophagitis: Secondary | ICD-10-CM | POA: Insufficient documentation

## 2011-03-26 DIAGNOSIS — Z79899 Other long term (current) drug therapy: Secondary | ICD-10-CM | POA: Insufficient documentation

## 2011-03-26 DIAGNOSIS — F0390 Unspecified dementia without behavioral disturbance: Secondary | ICD-10-CM | POA: Insufficient documentation

## 2011-03-26 DIAGNOSIS — I517 Cardiomegaly: Secondary | ICD-10-CM | POA: Insufficient documentation

## 2011-03-26 DIAGNOSIS — S52509A Unspecified fracture of the lower end of unspecified radius, initial encounter for closed fracture: Secondary | ICD-10-CM | POA: Insufficient documentation

## 2011-03-26 DIAGNOSIS — I10 Essential (primary) hypertension: Secondary | ICD-10-CM | POA: Insufficient documentation

## 2011-03-26 HISTORY — DX: Anemia, unspecified: D64.9

## 2011-03-26 HISTORY — DX: Gastro-esophageal reflux disease without esophagitis: K21.9

## 2011-03-26 HISTORY — DX: Unspecified osteoarthritis, unspecified site: M19.90

## 2011-03-26 HISTORY — DX: Essential (primary) hypertension: I10

## 2011-03-26 MED ORDER — ONDANSETRON HCL 4 MG/2ML IJ SOLN
4.0000 mg | Freq: Once | INTRAMUSCULAR | Status: AC
  Administered 2011-03-26: 4 mg via INTRAVENOUS
  Filled 2011-03-26: qty 2

## 2011-03-26 MED ORDER — DIAZEPAM 5 MG/ML IJ SOLN
5.0000 mg | Freq: Once | INTRAMUSCULAR | Status: AC
  Administered 2011-03-26: 5 mg via INTRAVENOUS
  Filled 2011-03-26: qty 2

## 2011-03-26 MED ORDER — MORPHINE SULFATE 4 MG/ML IJ SOLN
4.0000 mg | Freq: Once | INTRAMUSCULAR | Status: AC
  Administered 2011-03-26: 4 mg via INTRAVENOUS
  Filled 2011-03-26: qty 1

## 2011-03-26 NOTE — ED Notes (Signed)
Per EMS: pt from SNF, [facility], pt fell, pt found on the floor face down, non witnessed, pt demented, presented with splint to the right wrist/arm, placed by EMS, noted deformation and bruising to the same area. Pt very jittery and trying to get out of the bes Pt is DNR.

## 2011-03-26 NOTE — ED Provider Notes (Signed)
History     89yF s/p fall. Coming from nursing home. Unwitnessed fall. Pt unable to give reliable hx. C/o R wrist pain. Deformity noted. Trauma to head. No blood thinners per med list. Daughter at bedside and says mother at baseline mental status.  CSN: [id number]  Arrival date & time 03/26/11  2042   First MD Initiated Contact with Patient 03/26/11 2209      Chief Complaint  Patient presents with  . Fall    (Consider location/radiation/quality/duration/timing/severity/associated sxs/prior treatment) HPI  Past Medical History  Diagnosis Date  . Hypertension   . Glaucoma   . GERD (gastroesophageal reflux disease)   . Anemia   . Osteoarthritis     History reviewed. No pertinent past surgical history.  History reviewed. No pertinent family history.  History  Substance Use Topics  . Smoking status: Never Smoker   . Smokeless tobacco: Not on file  . Alcohol Use: No    OB History    Grav Para Term Preterm Abortions TAB SAB Ect Mult Living                  Review of Systems  Unable to perform because pt is demented.  Allergies  Review of patient's allergies indicates no known allergies.  Home Medications   Current Outpatient Rx  Name Route Sig Dispense Refill  . ACETAMINOPHEN 500 MG PO TABS Oral Take 1,000 mg by mouth 2 (two) times daily.      . ASPIRIN 81 MG PO CHEW Oral Chew 81 mg by mouth daily.      Marland Kitchen BIMATOPROST 0.03 % OP SOLN Both Eyes Place 1 drop into both eyes at bedtime.      Marland Kitchen BRIMONIDINE TARTRATE-TIMOLOL 0.2-0.5 % OP SOLN Both Eyes Place 1 drop into both eyes every 12 (twelve) hours.      Marland Kitchen CALCITONIN (SALMON) 200 UNIT/ACT NA SOLN Nasal Place 1 spray into the nose daily. Alternates nostrils every day     . CALCIUM CARBONATE-VITAMIN D 500-200 MG-UNIT PO TABS Oral Take 1 tablet by mouth 3 (three) times daily.      . CELECOXIB 200 MG PO CAPS Oral Take 200 mg by mouth daily.      . FUROSEMIDE 20 MG PO TABS Oral Take 20 mg by mouth 2 (two) times daily.       Marland Kitchen HYPROMELLOSE 2.5 % OP SOLN Both Eyes Place 1 drop into both eyes 4 (four) times daily.      Marland Kitchen PANTOPRAZOLE SODIUM 40 MG PO TBEC Oral Take 40 mg by mouth daily.      Marland Kitchen POTASSIUM CHLORIDE 10 MEQ PO TBCR Oral Take 10 mEq by mouth daily.      Scherrie November PO POWD Oral Take 1 scoop by mouth 3 (three) times daily with meals.      . SERTRALINE HCL 25 MG PO TABS Oral Take 25 mg by mouth daily.        BP 159/71  Pulse 70  Temp(Src) 98.6 F (37 C) (Oral)  Resp 16  SpO2 97%  Physical Exam  Constitutional: No distress.       Pt difficult to examine because combative. Pushing away. Spitting at times. Generally pleasant when left alone though.  HENT:  Right Ear: External ear normal.  Left Ear: External ear normal.  Mouth/Throat: Oropharynx is clear and moist.       Hematoma above R eye.   Eyes: Conjunctivae are normal. Pupils are equal, round, and reactive to light. Right eye  exhibits no discharge. Left eye exhibits no discharge.  Neck:       No midline spinal tenderness  Cardiovascular: Normal rate and regular rhythm.   Pulmonary/Chest: Effort normal and breath sounds normal.  Abdominal: Soft. She exhibits no distension.  Musculoskeletal:       Deformity to distal R wrist. Ecchymosis and mild swelling. Skin intact. Moving all fingers. Sensation appears intact to light touch. Good distal cap refill. Cannot fully assess motor function because pt being uncooperative.  Neurological:       No facial droop. PERRL. No obvious CN deficits. Moving all extremities.   Skin: Skin is warm and dry. She is not diaphoretic.  Psychiatric:       Demented. Aggressive when experiences pain.    ED Course  Procedures (including critical care time)   Labs Reviewed  CBC  BASIC METABOLIC PANEL   Dg Chest 1 View  03/27/2011  *RADIOLOGY REPORT*  Clinical Data: Wrist fracture.  Preop respiratory exam.  CHEST - 1 VIEW  Comparison: 11/26/2009  Findings: Cardiomegaly and ectasia of the thoracic aorta are  stable.  Both lungs are clear.  No evidence of pleural effusion.  IMPRESSION: Stable cardiomegaly.  No acute findings.  Original Report Authenticated By: Danae Orleans, M.D.   Dg Wrist Complete Right  03/26/2011  *RADIOLOGY REPORT*  Clinical Data: Fall.  Wrist fracture deformity.  Wrist pain.  RIGHT WRIST - COMPLETE 3+ VIEW  Comparison: None.  Findings: Fracture of the distal radial metaphysis is seen with intra-articular extension.  There is mild dorsal displacement angulation of the distal articular surface of the radius.  Associated fracture of the distal ulna is also seen with involvement of the distal radial ulnar joint.  Diffuse osteopenia is noted.  Advanced osteoarthritis is seen involving the base of the thumb.  IMPRESSION:  1.  Distal radial metaphyseal fracture with intra-articular extension.  Mild dorsal displacement and angulation of the distal articular surface. 2.  Distal ulnar fracture with involving the distal radial ulnar joint. 3.  Osteopenia.  Original Report Authenticated By: Danae Orleans, M.D.   Ct Head Wo Contrast  03/27/2011  *RADIOLOGY REPORT*  Clinical Data:  Status post fall; found on floor, with facial bruising.  Concern for head, facial or cervical spine injury.  CT HEAD WITHOUT CONTRAST CT MAXILLOFACIAL WITHOUT CONTRAST CT CERVICAL SPINE WITHOUT CONTRAST  Technique:  Multidetector CT imaging of the head, cervical spine, and maxillofacial structures were performed using the standard protocol without intravenous contrast. Multiplanar CT image reconstructions of the cervical spine and maxillofacial structures were also generated.  Comparison:  None  CT HEAD  Findings: There is no evidence of acute infarction, mass lesion, or intra- or extra-axial hemorrhage on CT.  Evaluation is significantly suboptimal due to motion artifact.  Prominence of the ventricles and sulci reflects moderate cortical volume loss.  Diffuse periventricular and subcortical white matter change likely reflects  small vessel ischemic microangiopathy. Chronic encephalomalacia is noted at the left temporal lobe and anterior right temporal lobe, on the maxillofacial CT.  The posterior fossa is not well assessed due to motion artifact. The basal ganglia are unremarkable in appearance.  The cerebral hemispheres demonstrate grossly normal gray-white differentiation. No mass effect or midline shift is seen.  There is no evidence of fracture; visualized osseous structures are unremarkable in appearance.  However, portions of the calvarium are not imaged on this study; these are better assessed on concurrent maxillofacial CT images.  The visualized portions of the orbits are  within normal limits.  The paranasal sinuses and mastoid air cells are well-aerated.  Soft tissue swelling is noted overlying the right frontal calvarium.  IMPRESSION:  1.  No definite evidence of traumatic intracranial injury; the study is significantly suboptimal due to motion artifact, and portions of the brain and calvarium are not imaged.  These areas are somewhat better characterized on the concurrent maxillofacial CT. 2.  Soft tissue swelling overlying the right frontal calvarium. 3.  Moderate cortical volume loss and diffuse small vessel ischemic microangiopathy; chronic encephalomalacia at the left temporal lobe and anterior right temporal lobe, reflecting remote infarct.  CT MAXILLOFACIAL  Findings:  There is no evidence of fracture or dislocation.  The maxilla and mandible appear intact.  The nasal bone is unremarkable in appearance.  The visualized dentition demonstrates no acute abnormality.  There is chronic anterior subluxation at the temporomandibular joints, more prominent on the right.  The orbits are intact bilaterally.  The paranasal sinuses and mastoid air cells are clear.  Soft tissue swelling is noted overlying the right frontal calvarium and surrounding the right orbit.  The parapharyngeal fat planes are preserved.  The nasopharynx,  oropharynx and hypopharynx are unremarkable in appearance.  The visualized portions of the valleculae and piriform sinuses are grossly unremarkable.  The parotid and submandibular glands are within normal limits.  No cervical lymphadenopathy is seen.  IMPRESSION:  1.  No evidence of fracture or dislocation. 2.  Soft tissue swelling overlying the right frontal calvarium and surrounding the right orbit. 3.  Chronic anterior subluxation at the temporomandibular joints, more prominent on the right.  CT CERVICAL SPINE  Findings:   There is no evidence of acute fracture or subluxation. Vertebral bodies demonstrate normal height.  There is grade 1 anterolisthesis of C6 on C7.  Degenerative change is noted about the dens.  There is fusion of the facet joints bilaterally. There is mild narrowing of the intervertebral disc spaces along the lower cervical spine.  Prevertebral soft tissues are within normal limits.  Mild scattered hypodensities and calcifications within the thyroid gland are nonspecific in appearance; no dominant mass is seen.  The visualized lung apices are clear.  Mild calcification is noted at the carotid bifurcations bilaterally.  IMPRESSION:  1.  No evidence of acute fracture or subluxation along the cervical spine. 2.  Mild degenerative change noted along the cervical spine. 3.  Mild calcification at the carotid bifurcations bilaterally.  Original Report Authenticated By: Tonia Ghent, M.D.   Ct Cervical Spine Wo Contrast  03/27/2011  *RADIOLOGY REPORT*  Clinical Data:  Status post fall; found on floor, with facial bruising.  Concern for head, facial or cervical spine injury.  CT HEAD WITHOUT CONTRAST CT MAXILLOFACIAL WITHOUT CONTRAST CT CERVICAL SPINE WITHOUT CONTRAST  Technique:  Multidetector CT imaging of the head, cervical spine, and maxillofacial structures were performed using the standard protocol without intravenous contrast. Multiplanar CT image reconstructions of the cervical spine and  maxillofacial structures were also generated.  Comparison:  None  CT HEAD  Findings: There is no evidence of acute infarction, mass lesion, or intra- or extra-axial hemorrhage on CT.  Evaluation is significantly suboptimal due to motion artifact.  Prominence of the ventricles and sulci reflects moderate cortical volume loss.  Diffuse periventricular and subcortical white matter change likely reflects small vessel ischemic microangiopathy. Chronic encephalomalacia is noted at the left temporal lobe and anterior right temporal lobe, on the maxillofacial CT.  The posterior fossa is not well assessed due to motion  artifact. The basal ganglia are unremarkable in appearance.  The cerebral hemispheres demonstrate grossly normal gray-white differentiation. No mass effect or midline shift is seen.  There is no evidence of fracture; visualized osseous structures are unremarkable in appearance.  However, portions of the calvarium are not imaged on this study; these are better assessed on concurrent maxillofacial CT images.  The visualized portions of the orbits are within normal limits.  The paranasal sinuses and mastoid air cells are well-aerated.  Soft tissue swelling is noted overlying the right frontal calvarium.  IMPRESSION:  1.  No definite evidence of traumatic intracranial injury; the study is significantly suboptimal due to motion artifact, and portions of the brain and calvarium are not imaged.  These areas are somewhat better characterized on the concurrent maxillofacial CT. 2.  Soft tissue swelling overlying the right frontal calvarium. 3.  Moderate cortical volume loss and diffuse small vessel ischemic microangiopathy; chronic encephalomalacia at the left temporal lobe and anterior right temporal lobe, reflecting remote infarct.  CT MAXILLOFACIAL  Findings:  There is no evidence of fracture or dislocation.  The maxilla and mandible appear intact.  The nasal bone is unremarkable in appearance.  The visualized  dentition demonstrates no acute abnormality.  There is chronic anterior subluxation at the temporomandibular joints, more prominent on the right.  The orbits are intact bilaterally.  The paranasal sinuses and mastoid air cells are clear.  Soft tissue swelling is noted overlying the right frontal calvarium and surrounding the right orbit.  The parapharyngeal fat planes are preserved.  The nasopharynx, oropharynx and hypopharynx are unremarkable in appearance.  The visualized portions of the valleculae and piriform sinuses are grossly unremarkable.  The parotid and submandibular glands are within normal limits.  No cervical lymphadenopathy is seen.  IMPRESSION:  1.  No evidence of fracture or dislocation. 2.  Soft tissue swelling overlying the right frontal calvarium and surrounding the right orbit. 3.  Chronic anterior subluxation at the temporomandibular joints, more prominent on the right.  CT CERVICAL SPINE  Findings:   There is no evidence of acute fracture or subluxation. Vertebral bodies demonstrate normal height.  There is grade 1 anterolisthesis of C6 on C7.  Degenerative change is noted about the dens.  There is fusion of the facet joints bilaterally. There is mild narrowing of the intervertebral disc spaces along the lower cervical spine.  Prevertebral soft tissues are within normal limits.  Mild scattered hypodensities and calcifications within the thyroid gland are nonspecific in appearance; no dominant mass is seen.  The visualized lung apices are clear.  Mild calcification is noted at the carotid bifurcations bilaterally.  IMPRESSION:  1.  No evidence of acute fracture or subluxation along the cervical spine. 2.  Mild degenerative change noted along the cervical spine. 3.  Mild calcification at the carotid bifurcations bilaterally.  Original Report Authenticated By: Tonia Ghent, M.D.   Ct Maxillofacial Wo Cm  03/27/2011  *RADIOLOGY REPORT*  Clinical Data:  Status post fall; found on floor, with  facial bruising.  Concern for head, facial or cervical spine injury.  CT HEAD WITHOUT CONTRAST CT MAXILLOFACIAL WITHOUT CONTRAST CT CERVICAL SPINE WITHOUT CONTRAST  Technique:  Multidetector CT imaging of the head, cervical spine, and maxillofacial structures were performed using the standard protocol without intravenous contrast. Multiplanar CT image reconstructions of the cervical spine and maxillofacial structures were also generated.  Comparison:  None  CT HEAD  Findings: There is no evidence of acute infarction, mass lesion, or intra- or extra-axial  hemorrhage on CT.  Evaluation is significantly suboptimal due to motion artifact.  Prominence of the ventricles and sulci reflects moderate cortical volume loss.  Diffuse periventricular and subcortical white matter change likely reflects small vessel ischemic microangiopathy. Chronic encephalomalacia is noted at the left temporal lobe and anterior right temporal lobe, on the maxillofacial CT.  The posterior fossa is not well assessed due to motion artifact. The basal ganglia are unremarkable in appearance.  The cerebral hemispheres demonstrate grossly normal gray-white differentiation. No mass effect or midline shift is seen.  There is no evidence of fracture; visualized osseous structures are unremarkable in appearance.  However, portions of the calvarium are not imaged on this study; these are better assessed on concurrent maxillofacial CT images.  The visualized portions of the orbits are within normal limits.  The paranasal sinuses and mastoid air cells are well-aerated.  Soft tissue swelling is noted overlying the right frontal calvarium.  IMPRESSION:  1.  No definite evidence of traumatic intracranial injury; the study is significantly suboptimal due to motion artifact, and portions of the brain and calvarium are not imaged.  These areas are somewhat better characterized on the concurrent maxillofacial CT. 2.  Soft tissue swelling overlying the right frontal  calvarium. 3.  Moderate cortical volume loss and diffuse small vessel ischemic microangiopathy; chronic encephalomalacia at the left temporal lobe and anterior right temporal lobe, reflecting remote infarct.  CT MAXILLOFACIAL  Findings:  There is no evidence of fracture or dislocation.  The maxilla and mandible appear intact.  The nasal bone is unremarkable in appearance.  The visualized dentition demonstrates no acute abnormality.  There is chronic anterior subluxation at the temporomandibular joints, more prominent on the right.  The orbits are intact bilaterally.  The paranasal sinuses and mastoid air cells are clear.  Soft tissue swelling is noted overlying the right frontal calvarium and surrounding the right orbit.  The parapharyngeal fat planes are preserved.  The nasopharynx, oropharynx and hypopharynx are unremarkable in appearance.  The visualized portions of the valleculae and piriform sinuses are grossly unremarkable.  The parotid and submandibular glands are within normal limits.  No cervical lymphadenopathy is seen.  IMPRESSION:  1.  No evidence of fracture or dislocation. 2.  Soft tissue swelling overlying the right frontal calvarium and surrounding the right orbit. 3.  Chronic anterior subluxation at the temporomandibular joints, more prominent on the right.  CT CERVICAL SPINE  Findings:   There is no evidence of acute fracture or subluxation. Vertebral bodies demonstrate normal height.  There is grade 1 anterolisthesis of C6 on C7.  Degenerative change is noted about the dens.  There is fusion of the facet joints bilaterally. There is mild narrowing of the intervertebral disc spaces along the lower cervical spine.  Prevertebral soft tissues are within normal limits.  Mild scattered hypodensities and calcifications within the thyroid gland are nonspecific in appearance; no dominant mass is seen.  The visualized lung apices are clear.  Mild calcification is noted at the carotid bifurcations  bilaterally.  IMPRESSION:  1.  No evidence of acute fracture or subluxation along the cervical spine. 2.  Mild degenerative change noted along the cervical spine. 3.  Mild calcification at the carotid bifurcations bilaterally.  Original Report Authenticated By: Tonia Ghent, M.D.    EKG:  Rhythm: normal sinus Rate: 81 Axis: Left Intervals: 1st degree AV block. Bifasicular block ST segments: NS ST changes  1:05 AM Discussed with hand. Will see in ED.   1. Closed head  injury   2. Radius with ulna, distal end fracture   3. Facial contusion       MDM  89yf with r wrist pain. Closed distal radius/ulna fx with intrarticular extension. No other serious acute traumatic injuries. Discussed with hand who will see and evaluate, but suspect likely splint and pain control. Pt hemodynamically stable and medically cleared once wrist fx managed.        Raeford Razor, MD 04/03/11 1024

## 2011-03-27 ENCOUNTER — Emergency Department (HOSPITAL_COMMUNITY): Payer: Medicare Other

## 2011-03-27 LAB — CBC
HCT: 35.2 % — ABNORMAL LOW (ref 36.0–46.0)
Hemoglobin: 11.7 g/dL — ABNORMAL LOW (ref 12.0–15.0)
MCH: 29.6 pg (ref 26.0–34.0)
MCHC: 33.2 g/dL (ref 30.0–36.0)
MCV: 89.1 fL (ref 78.0–100.0)
Platelets: 244 10*3/uL (ref 150–400)
RBC: 3.95 MIL/uL (ref 3.87–5.11)
RDW: 13 % (ref 11.5–15.5)
WBC: 16.3 10*3/uL — ABNORMAL HIGH (ref 4.0–10.5)

## 2011-03-27 LAB — BASIC METABOLIC PANEL
BUN: 61 mg/dL — ABNORMAL HIGH (ref 6–23)
CO2: 22 mEq/L (ref 19–32)
Calcium: 9 mg/dL (ref 8.4–10.5)
Chloride: 104 mEq/L (ref 96–112)
Creatinine, Ser: 1.29 mg/dL — ABNORMAL HIGH (ref 0.50–1.10)
GFR calc Af Amer: 40 mL/min — ABNORMAL LOW (ref >90)
GFR calc non Af Amer: 34 mL/min — ABNORMAL LOW (ref >90)
Glucose, Bld: 98 mg/dL (ref 70–99)
Potassium: 4.9 mEq/L (ref 3.5–5.1)
Sodium: 137 mEq/L (ref 135–145)

## 2011-03-27 MED ORDER — LIDOCAINE HCL 2 % IJ SOLN
INTRAMUSCULAR | Status: AC
  Filled 2011-03-27: qty 1

## 2011-03-27 MED ORDER — SODIUM CHLORIDE 0.9 % IV BOLUS (SEPSIS)
1000.0000 mL | Freq: Once | INTRAVENOUS | Status: AC
  Administered 2011-03-27: 1000 mL via INTRAVENOUS

## 2011-03-27 MED ORDER — SODIUM CHLORIDE 0.9 % IV SOLN: Freq: Once | INTRAVENOUS | Status: DC

## 2011-03-27 NOTE — Consult Note (Signed)
Alexis Archer, Alexis Archer NO.:  [id number]  MEDICAL RECORD NO.:  [id number]  LOCATION:  WOTF                         FACILITY:  [facility]  PHYSICIAN:  Dionne Ano. Rica Heather, M.D.DATE OF BIRTH:  December 21, 1914  DATE OF CONSULTATION: DATE OF DISCHARGE:  03/27/2011                                CONSULTATION   HISTORY OF PRESENT ILLNESS:  I had the pleasure to see Alexis Archer today in the [facility] Emergency Room.  Ms. Dick is a 76 year old female who fell today.  She has rested in [place] forearm and is in the skilled level.  I have discussed all issues with her daughter.  In terms of her level of activity, she does not cook.  She needs help in dressing.  She is primarily wheelchair bound.  The patient and I have reviewed this.  PAST MEDICAL HISTORY:  Includes; 1. Hypertension. 2. Glaucoma. 3. Gastroesophageal reflux disease. 4. Osteoarthritis. 5. Anemia.  SOCIAL HISTORY:  She is a nonsmoker.  Does not use alcohol or any illicit drug use.  ALLERGIES:  No known drug allergies.  Medical history and problem list are reviewed in her chart at length.  Her examination is notable for swelling and ecchymosis about her right wrist which I was asked to see.  She has refill to tips of the fingers. No obvious compartment syndrome, but a large amount of ecchymosis and bruising.  Elbow is stable.  Shoulder is stable.  Left upper extremity is neurovascular intact.  Lower extremity examination is benign.  The patient has contusive injury to the periorbital region of her right forehead.  CT scan has been reviewed by emergency room staff and her physicians have addressed this.  X-rays of the wrist showed displaced distal radius fracture with associated ulnar fracture intra-articularly with a high degree of degenerative change present especially at the mid carpal and CMC joints of the thumb.  I have reviewed this at length and findings.  IMPRESSION: 1. Status post fall with a  contusion to the head. 2. Status post fall with closed distal radius fracture, right upper     extremity.  RECOMMENDATIONS:  I have gone ahead and performed a closed reduction under hematoma block.  The patient was prepped and draped with Hibiclens followed by Betadine, followed by hematoma block administered to the fracture site.  She was then placed in finger trap traction and underwent manipulative closed reduction.  I placed Xeroform on the skin as I did fear that her skin is very thin and frail.  Following this, I then placed a stockinette, Webril and placed in a sugar-tong splint after reduction was accomplished.  Postreduction x-rays were reviewed by myself.  I would accept some degree of displacement and collapse in this patient given her activity level and age.  I have discussed this with her daughter who concurs.  I would like to see her back in the office in 7 days to 10 days for followup care plan.  Should any problems arise, she will notify me. Sling was dispensed, post fracture measures were dispensed as well.  All questions have been encouraged and answered.  Should any problems arise, she will notify me, otherwise looking forward to  seeing her back in the office in 7-10 days for x-rays.  It is a pleasure to see her today and we look forward to this pain in her postreduction algorithm.     Dionne Ano. Amanda Pea, M.D.     [facility]  D:  03/27/2011  T:  03/27/2011  Job:  [id number]

## 2011-03-27 NOTE — Consult Note (Signed)
Full consult dictated on #   [id number] SP Distal radius fracture treated with closed reduction @315am  03/27/11  .Marland KitchenKeep bandage clean and dry.  Call for any problems.  No smoking.  Criteria for driving a car: you should be off your pain medicine for 7-8 hours, able to drive one handed(confident), thinking clearly and feeling able in your judgement to drive. Continue elevation as it will decrease swelling.  If instructed by MD move your fingers within the confines of the bandage/splint.  Use ice if instructed by your MD. Call immediately for any sudden loss of feeling in your hand/arm or change in functional abilities of the extremity.  Marland Kitchen.We recommend that you to take vitamin C 1000 mg a day to promote healing we also recommend that if you require her pain medicine that he take a stool softener to prevent constipation as most pain medicines will have constipation side effects. We recommend either Peri-Colace or Senokot and recommend that you also consider adding MiraLAX to prevent the constipation affects from pain medicine if you are required to use them. These medicines are over the counter and maybe purchased at a local pharmacy.  Dominica Severin MD

## 2011-03-27 NOTE — ED Notes (Signed)
Patient transported to CT 

## 2012-06-28 ENCOUNTER — Other Ambulatory Visit: Payer: Self-pay | Admitting: Geriatric Medicine

## 2012-06-28 MED ORDER — LORAZEPAM 1 MG PO TABS: ORAL_TABLET | ORAL | Status: DC

## 2012-07-27 ENCOUNTER — Non-Acute Institutional Stay (SKILLED_NURSING_FACILITY): Payer: Medicare Other | Admitting: Internal Medicine

## 2012-07-27 DIAGNOSIS — I10 Essential (primary) hypertension: Secondary | ICD-10-CM

## 2012-07-27 DIAGNOSIS — E876 Hypokalemia: Secondary | ICD-10-CM

## 2012-07-27 DIAGNOSIS — M1990 Unspecified osteoarthritis, unspecified site: Secondary | ICD-10-CM

## 2012-07-27 DIAGNOSIS — M810 Age-related osteoporosis without current pathological fracture: Secondary | ICD-10-CM

## 2012-08-11 DIAGNOSIS — M810 Age-related osteoporosis without current pathological fracture: Secondary | ICD-10-CM | POA: Insufficient documentation

## 2012-08-11 DIAGNOSIS — M1990 Unspecified osteoarthritis, unspecified site: Secondary | ICD-10-CM | POA: Insufficient documentation

## 2012-08-11 DIAGNOSIS — E876 Hypokalemia: Secondary | ICD-10-CM | POA: Insufficient documentation

## 2012-08-11 DIAGNOSIS — I10 Essential (primary) hypertension: Secondary | ICD-10-CM | POA: Insufficient documentation

## 2012-08-11 NOTE — Progress Notes (Signed)
Patient ID: Alexis Archer, female   DOB: 06-07-1914, 79 y.o.   MRN: [id number]        PROGRESS NOTE  DATE:  07/27/2012   FACILITY: Nursing Home Location: [facility]  LEVEL OF CARE: SNF (31)  Routine Visit  CHIEF COMPLAINT:  Manage hypertension and osteoarthritis.    HISTORY OF PRESENT ILLNESS:  REASSESSMENT OF ONGOING PROBLEM(S):  HTN: Pt 's HTN remains stable.  Denies CP, sob, DOE, pedal edema, headaches, dizziness or visual disturbances.  No complications from the medications currently being used.  Last BP : 115/72.  ARTHRITIS: Patient's arthritis remains stable.  The patient denies ongoing joint pains, stiffness, swelling, warmth & redness.  No complications reported from the medication(s) currently being used.   PAST MEDICAL HISTORY : Reviewed.  No changes.  CURRENT MEDICATIONS: Reviewed per [facility]  REVIEW OF SYSTEMS:  GENERAL: no change in appetite, no fatigue, no weight changes, no fever, chills or weakness RESPIRATORY: no cough, SOB, DOE, wheezing, hemoptysis CARDIAC: no chest pain, edema or palpitations GI: no abdominal pain, diarrhea, constipation, heart burn, nausea or vomiting  PHYSICAL EXAMINATION  VS:  T 97.9      P 74      RR 18      BP 115/72     POX %     WT (Lb) 154.2  GENERAL: no acute distress, normal body habitus NECK: supple, trachea midline, no neck masses, no thyroid tenderness, no thyromegaly RESPIRATORY: breathing is even & unlabored, BS CTAB CARDIAC: RRR, no murmur,no extra heart sounds EDEMA/VARICOSITIES:  +1 bilateral lower extremity edema  ARTERIAL: pedal pulses +1  GI: abdomen soft, normal BS, no masses, no tenderness, no hepatomegaly, no splenomegaly PSYCHIATRIC: the patient is alert & oriented to person, affect & behavior appropriate  LABS/RADIOLOGY: 04/2012/:  CBC normal.   Glucose 108, BUN 26, creatinine 1.13, otherwise CMP normal.   Magnesium 1.8.  ASSESSMENT/PLAN:  Hypertension.  Well controlled.    Osteoarthritis.  Denies ongoing pain.    Osteoporosis.  Continue current medications.   Hypokalemia.  Well repleted.   Depression.  Continue Zoloft.    CPT CODE: 40981

## 2012-08-28 ENCOUNTER — Non-Acute Institutional Stay (SKILLED_NURSING_FACILITY): Payer: Medicare Other | Admitting: Internal Medicine

## 2012-08-28 DIAGNOSIS — I959 Hypotension, unspecified: Secondary | ICD-10-CM

## 2012-08-28 DIAGNOSIS — R079 Chest pain, unspecified: Secondary | ICD-10-CM

## 2012-08-28 NOTE — Progress Notes (Signed)
Patient ID: Alexis Archer, female   DOB: 10-09-1914, 79 y.o.   MRN: [id number] Chief complaint; chest pain, low blood pressure.  History; this is a long-standing resident of the facility. She complained to the nurses earlier this morning of chest pain. They felt that she looked more pale than usual. Blood pressure is 93/72, after she was put back to bed 80/56. Paradoxically the patient is no longer complaining of chest pain, but bilateral knee pain. Looking back through her chart she seems to last admitted to hospital in 2001 at which time she complained of chest pain. Her d-dimer was mildly elevated, but CT angiogram did not show a PE. Her EKG did not show obvious ischemic changes. Her cardiac markers were negative x3, and she was returned to the facility.  Review of systems; dementia, is too severe to be considered reliable. However, she is not complaining of chest pain.  Physical exam temperature is afebrile, pulse 62, respirations 18, blood pressure 76 to palpation. Supine. Initially, the facility recorded 80/56, when they laid her down. Gen. she does not look to be in distress. Respiratory clear entry bilaterally. Cardiac heart sounds are normal. No murmurs no gallops. She does not appear to be dehydrated, nor is there any signs of heart failure. Chest wall diffuse tenderness along the costochondral joints. Abdomen diffusely tender, however, bowel sounds are positive. No guarding no rebound. No liver no spleen no masses. GU bladder is not distended. No clear CVA or bladder tenderness. Extremities No evidence of a DVT.   Impression/plan #1 hypotension; although this is somewhat unusual. I am really not sure of the exact etiology of this at the bedside. Certainly, the complaint of chest pain, would raise the possibility of cardiac ischemia as the #1 possibility. She did have a large diarrheal stool present. However, she does not appear to be dehydrated. Only medication of note, with the Lasix, 20  mg. I ordered some lab work, IV normal saline at 150 cc an hour. I have spoken to her daughter [name] on the phone with regards to the ethics of hospital transfer in somebody with advanced stage and severe dementia. She is coming to the facility in an hour to 2 to discuss this further. In the meantime, I have written orders, including labs, IV fluids. Hold her Lasix

## 2012-08-30 ENCOUNTER — Non-Acute Institutional Stay (SKILLED_NURSING_FACILITY): Payer: Medicare Other | Admitting: Adult Health

## 2012-08-30 ENCOUNTER — Encounter: Payer: Self-pay | Admitting: Adult Health

## 2012-08-30 DIAGNOSIS — H409 Unspecified glaucoma: Secondary | ICD-10-CM

## 2012-08-30 DIAGNOSIS — M1990 Unspecified osteoarthritis, unspecified site: Secondary | ICD-10-CM

## 2012-08-30 DIAGNOSIS — E876 Hypokalemia: Secondary | ICD-10-CM

## 2012-08-30 DIAGNOSIS — R609 Edema, unspecified: Secondary | ICD-10-CM

## 2012-08-30 DIAGNOSIS — F329 Major depressive disorder, single episode, unspecified: Secondary | ICD-10-CM

## 2012-08-30 DIAGNOSIS — M810 Age-related osteoporosis without current pathological fracture: Secondary | ICD-10-CM

## 2012-08-30 NOTE — Assessment & Plan Note (Addendum)
Without change in status will continue miacalcin spray daily and calcium with d three time daily

## 2012-09-19 ENCOUNTER — Non-Acute Institutional Stay (SKILLED_NURSING_FACILITY): Payer: Medicare Other | Admitting: Internal Medicine

## 2012-09-19 DIAGNOSIS — M1990 Unspecified osteoarthritis, unspecified site: Secondary | ICD-10-CM

## 2012-09-19 DIAGNOSIS — E876 Hypokalemia: Secondary | ICD-10-CM

## 2012-09-19 DIAGNOSIS — I10 Essential (primary) hypertension: Secondary | ICD-10-CM

## 2012-09-19 DIAGNOSIS — M810 Age-related osteoporosis without current pathological fracture: Secondary | ICD-10-CM

## 2012-09-19 NOTE — Progress Notes (Signed)
Patient ID: Alexis Archer, female   DOB: Aug 20, 1914, 79 y.o.   MRN: [id number]        PROGRESS NOTE  DATE:  09/19/2012   FACILITY: Nursing Home Location: [facility]  LEVEL OF CARE: SNF (31)  Routine Visit  CHIEF COMPLAINT:  Manage hypertension and osteoarthritis.    HISTORY OF PRESENT ILLNESS:  REASSESSMENT OF ONGOING PROBLEM(S):  HTN: Pt 's HTN remains stable.  Denies CP, sob, DOE, pedal edema, headaches, dizziness or visual disturbances.  No complications from the medications currently being used.  Last BP : 163/63, 115/72.  ARTHRITIS: Patient's arthritis remains stable.  The patient denies ongoing joint pains, stiffness, swelling, warmth & redness.  No complications reported from the medication(s) currently being used.   PAST MEDICAL HISTORY : Reviewed.  No changes.  CURRENT MEDICATIONS: Reviewed per [facility]  REVIEW OF SYSTEMS:  GENERAL: no change in appetite, no fatigue, no weight changes, no fever, chills or weakness RESPIRATORY: no cough, SOB, DOE, wheezing, hemoptysis CARDIAC: no chest pain, edema or palpitations GI: no abdominal pain, diarrhea, constipation, heart burn, nausea or vomiting  PHYSICAL EXAMINATION  VS:  T 97.1      P 78      RR 20     BP 163/63     POX %     WT (Lb) 155  GENERAL: no acute distress, normal body habitus NECK: supple, trachea midline, no neck masses, no thyroid tenderness, no thyromegaly RESPIRATORY: breathing is even & unlabored, BS CTAB CARDIAC: RRR, no murmur,no extra heart sounds EDEMA/VARICOSITIES:  +1 bilateral lower extremity edema  ARTERIAL: pedal pulses +1  GI: abdomen soft, normal BS, no masses, no tenderness, no hepatomegaly, no splenomegaly PSYCHIATRIC: the patient is alert & oriented to person, affect & behavior appropriate  LABS/RADIOLOGY: 04/2012/:  CBC normal.   Glucose 108, BUN 26, creatinine 1.13, otherwise CMP normal.   Magnesium 1.8.  ASSESSMENT/PLAN:  Hypertension.  Blood pressure  elevated. We'll review of BP log.  Osteoarthritis.  Denies ongoing pain.    Osteoporosis.  Continue current medications.   Hypokalemia.  Well repleted.   Depression.  Continue Zoloft.    CPT CODE: 40981

## 2012-10-10 ENCOUNTER — Non-Acute Institutional Stay (SKILLED_NURSING_FACILITY): Payer: Medicare Other | Admitting: Internal Medicine

## 2012-10-10 DIAGNOSIS — E876 Hypokalemia: Secondary | ICD-10-CM

## 2012-10-10 DIAGNOSIS — M810 Age-related osteoporosis without current pathological fracture: Secondary | ICD-10-CM

## 2012-10-10 DIAGNOSIS — M1990 Unspecified osteoarthritis, unspecified site: Secondary | ICD-10-CM

## 2012-10-10 DIAGNOSIS — I10 Essential (primary) hypertension: Secondary | ICD-10-CM

## 2012-10-12 NOTE — Progress Notes (Signed)
Patient ID: Alexis Archer, female   DOB: [birth year], 79 y.o.   MRN: [id number]        PROGRESS NOTE  DATE:  10/10/2012   FACILITY: Nursing Home Location: [facility]  LEVEL OF CARE: SNF (31)  Routine Visit  CHIEF COMPLAINT:  Manage hypertension and osteoarthritis.    HISTORY OF PRESENT ILLNESS:  REASSESSMENT OF ONGOING PROBLEM(S):  HTN: Pt 's HTN remains stable.  Denies CP, sob, DOE, pedal edema, headaches, dizziness or visual disturbances.  No complications from the medications currently being used.  Last BP : 132/74,163/63, 115/72.  ARTHRITIS: Patient's arthritis remains stable.  The patient denies ongoing joint pains, stiffness, swelling, warmth & redness.  No complications reported from the medication(s) currently being used.   PAST MEDICAL HISTORY : Reviewed.  No changes.  CURRENT MEDICATIONS: Reviewed per [facility]  REVIEW OF SYSTEMS:  GENERAL: no change in appetite, no fatigue, no weight changes, no fever, chills or weakness RESPIRATORY: no cough, SOB, DOE, wheezing, hemoptysis CARDIAC: no chest pain, edema or palpitations GI: no abdominal pain, diarrhea, constipation, heart burn, nausea or vomiting  PHYSICAL EXAMINATION  VS:  T 97.6     P 59     RR 20     BP 163/63     POX %     WT (Lb) 154.2  GENERAL: no acute distress, normal body habitus NECK: supple, trachea midline, no neck masses, no thyroid tenderness, no thyromegaly RESPIRATORY: breathing is even & unlabored, BS CTAB CARDIAC: RRR, no murmur,no extra heart sounds EDEMA/VARICOSITIES:  +1 bilateral lower extremity edema  ARTERIAL: pedal pulses +1  GI: abdomen soft, normal BS, no masses, no tenderness, no hepatomegaly, no splenomegaly PSYCHIATRIC: the patient is alert & oriented to person, affect & behavior appropriate  LABS/RADIOLOGY:  5/14 Hb 11.8, mcv 84.5 ow cbc nl, bun 40, cr 1.28 ow bmp nl 04/2012/:  CBC normal.   Glucose 108, BUN 26, creatinine 1.13, otherwise CMP normal.    Magnesium 1.8.  ASSESSMENT/PLAN:  Hypertension.  Stable.  Osteoarthritis.  Denies ongoing pain.    Osteoporosis.  Continue current medications.   Hypokalemia.  Well repleted.   Depression.  Continue Zoloft.    Anemia of chronic dz-stable.  Check liver profile.  CPT CODE: 91478

## 2012-10-19 DIAGNOSIS — R609 Edema, unspecified: Secondary | ICD-10-CM | POA: Insufficient documentation

## 2012-10-19 DIAGNOSIS — H409 Unspecified glaucoma: Secondary | ICD-10-CM | POA: Insufficient documentation

## 2012-10-19 DIAGNOSIS — F329 Major depressive disorder, single episode, unspecified: Secondary | ICD-10-CM | POA: Insufficient documentation

## 2012-10-19 NOTE — Assessment & Plan Note (Signed)
Will continue tylenol 500 mg three times daily

## 2012-10-19 NOTE — Assessment & Plan Note (Signed)
Will continue k+ 10 meq daily  

## 2012-10-19 NOTE — Assessment & Plan Note (Signed)
Will continue combigan to both eyes twice daily and xalatan to both eyes at bedtime

## 2012-10-19 NOTE — Assessment & Plan Note (Signed)
Is stable will continue zoloft 50 mg daily and ativan 0.25 mg every 6 hours as needed for anxiety and will monitor

## 2012-10-19 NOTE — Assessment & Plan Note (Signed)
Will continue lasix 20 mg daily with k+ 10 meq daily and will monitor

## 2012-10-19 NOTE — Progress Notes (Signed)
Patient ID: Alexis Archer, female   DOB: Aug 16, 1914, 79 y.o.   MRN: [id number]  [facility]  No Known Allergies   Chief Complaint  Patient presents with  . Medical Managment of Chronic Issues    HPI: She is being seen for the management of chronic illnesses. She is complaining of shortness of breath; she cannot fully participate in the hpi or ros. The nursing staff does not report any change in her behavior or appetite there are no reports of fever present.   Past Medical History  Diagnosis Date  . Hypertension   . Glaucoma   . GERD (gastroesophageal reflux disease)   . Anemia   . Osteoarthritis     No past surgical history on file.  VITAL SIGNS BP 111/61  Pulse 68  Ht 5\' 6"  (1.676 m)  Wt 155 lb 3.2 oz (70.398 kg)  BMI 25.06 kg/m2   Patient's Medications  New Prescriptions   No medications on file  Previous Medications   ACETAMINOPHEN (TYLENOL) 500 MG TABLET    Take 500 mg by mouth 3 (three) times daily.    BRIMONIDINE-TIMOLOL (COMBIGAN) 0.2-0.5 % OPHTHALMIC SOLUTION    Place 1 drop into both eyes every 12 (twelve) hours.     CALCITONIN, SALMON, (MIACALCIN/FORTICAL) 200 UNIT/ACT NASAL SPRAY    Place 1 spray into the nose daily. Alternates nostrils every day    CALCIUM-VITAMIN D (OSCAL WITH D) 500-200 MG-UNIT PER TABLET    Take 1 tablet by mouth 3 (three) times daily.     FUROSEMIDE (LASIX) 20 MG TABLET    Take 10 mg by mouth daily.    LATANOPROST (XALATAN) 0.005 % OPHTHALMIC SOLUTION    Place 1 drop into both eyes at bedtime.   LORAZEPAM (ATIVAN) 0.5 MG TABLET    Take 0.25 mg by mouth every 6 (six) hours as needed for anxiety.   POTASSIUM CHLORIDE (KLOR-CON) 10 MEQ CR TABLET    Take 10 mEq by mouth daily.     SERTRALINE (ZOLOFT) 25 MG TABLET    Take 50 mg by mouth daily.   Modified Medications   No medications on file  Discontinued Medications   ASPIRIN 81 MG CHEWABLE TABLET    Chew 81 mg by mouth daily.     BIMATOPROST (LUMIGAN) 0.03 % OPHTHALMIC SOLUTION    Place 1  drop into both eyes at bedtime.     CELECOXIB (CELEBREX) 200 MG CAPSULE    Take 200 mg by mouth daily.     HYDROXYPROPYL METHYLCELLULOSE (ISOPTO TEARS) 2.5 % OPHTHALMIC SOLUTION    Place 1 drop into both eyes 4 (four) times daily.     LORAZEPAM (ATIVAN) 1 MG TABLET    Take one tablet by mouth prior to dental procedure as directed.   PANTOPRAZOLE (PROTONIX) 40 MG TABLET    Take 40 mg by mouth daily.     PROTEIN SUPPLEMENT (RESOURCE BENEPROTEIN) POWD    Take 1 scoop by mouth 3 (three) times daily with meals.      SIGNIFICANT DIAGNOSTIC EXAMS   08-28-12: ekg: sinus rhythm with pvc   LABS REVIEWED:  04-14-12: wbc 6.6; hgb 12.8; hct 38.6 ;mcv 85.8; plt 278; glucose 108; bun 26; creat 1.13; k+ 4.4 Naa++ 138; liver normal albumin 3.7; mag 1.8; crp 0.6 04-17-12: urine culture: k. Pneumoniae: levaquin 08-28-12: wbc 6.7; hgb 11.8; hct 35.0; mvc 84.5 ;plt 275; glucose 90; bun 40; creat 1.28; k+ 4.8; Na++ 140   Review of Systems  Unable to perform ROS  Physical Exam  Constitutional: She appears well-developed and well-nourished.  Neck: Neck supple. No JVD present.  Cardiovascular: Normal rate, regular rhythm and intact distal pulses.   Respiratory: Effort normal. She has wheezes.  Has few fine scattered wheezes present  GI: Soft. Bowel sounds are normal. She exhibits no distension. There is no tenderness.  Musculoskeletal: She exhibits no edema.  Neurological: She is alert.  Skin: Skin is dry.      ASSESSMENT/ PLAN:  Osteoporosis, unspecified Without change in status will continue miacalcin spray daily and calcium with d three time daily   Hypopotassemia Will continue k+ 10 meq daily  Edema Will continue lasix 20 mg daily with k+ 10 meq daily and will monitor   Osteoarthrosis, unspecified whether generalized or localized, unspecified site Will continue tylenol 500 mg three times daily   Depression Is stable will continue zoloft 50 mg daily and ativan 0.25 mg every 6 hours as  needed for anxiety and will monitor   Glaucoma Will continue combigan to both eyes twice daily and xalatan to both eyes at bedtime   for her shortness of breath will get a chest x-ray and will treat as indicated.

## 2012-11-28 ENCOUNTER — Non-Acute Institutional Stay (SKILLED_NURSING_FACILITY): Payer: Medicare Other | Admitting: Internal Medicine

## 2012-11-28 DIAGNOSIS — M1990 Unspecified osteoarthritis, unspecified site: Secondary | ICD-10-CM

## 2012-11-28 DIAGNOSIS — M810 Age-related osteoporosis without current pathological fracture: Secondary | ICD-10-CM

## 2012-11-28 DIAGNOSIS — I10 Essential (primary) hypertension: Secondary | ICD-10-CM

## 2012-11-28 DIAGNOSIS — E876 Hypokalemia: Secondary | ICD-10-CM

## 2012-11-28 NOTE — Progress Notes (Signed)
Patient ID: Alexis Archer, female   DOB: [birth year], 79 y.o.   MRN: [id number]        PROGRESS NOTE  DATE:  11/28/2012   FACILITY: Nursing Home Location: [facility]  LEVEL OF CARE: SNF (31)  Routine Visit  CHIEF COMPLAINT:  Manage hypertension and osteoarthritis.    HISTORY OF PRESENT ILLNESS:  REASSESSMENT OF ONGOING PROBLEM(S):  HTN: Pt 's HTN remains stable.  Denies CP, sob, DOE, pedal edema, headaches, dizziness or visual disturbances.  No complications from the medications currently being used.  Last BP : 132/74,163/63, 115/72, 132/88.  ARTHRITIS: Patient's arthritis remains stable.  The patient denies ongoing joint pains, stiffness, swelling, warmth & redness.  No complications reported from the medication(s) currently being used.   PAST MEDICAL HISTORY : Reviewed.  No changes.  CURRENT MEDICATIONS: Reviewed per [facility]  REVIEW OF SYSTEMS:  GENERAL: no change in appetite, no fatigue, no weight changes, no fever, chills or weakness RESPIRATORY: no cough, SOB, DOE, wheezing, hemoptysis CARDIAC: no chest pain, edema or palpitations GI: no abdominal pain, diarrhea, constipation, heart burn, nausea or vomiting  PHYSICAL EXAMINATION  VS:  T 98.2    P 88     RR 18     BP 132/88     POX %     WT (Lb) 159.4  GENERAL: no acute distress, normal body habitus NECK: supple, trachea midline, no neck masses, no thyroid tenderness, no thyromegaly RESPIRATORY: breathing is even & unlabored, BS CTAB CARDIAC: RRR, no murmur,no extra heart sounds EDEMA/VARICOSITIES:  +1 bilateral lower extremity edema  ARTERIAL: pedal pulses +1  GI: abdomen soft, normal BS, no masses, no tenderness, no hepatomegaly, no splenomegaly PSYCHIATRIC: the patient is alert & oriented to person, affect & behavior appropriate  LABS/RADIOLOGY:  7/14 total protein 5.7 otherwise liver profile normal  5/14 Hb 11.8, mcv 84.5 ow cbc nl, bun 40, cr 1.28 ow bmp nl 04/2012/:  CBC normal.    Glucose 108, BUN 26, creatinine 1.13, otherwise CMP normal.   Magnesium 1.8.  ASSESSMENT/PLAN:  Hypertension.  Stable.  Osteoarthritis.  Denies ongoing pain.    Osteoporosis.  Continue current medications.   Hypokalemia.  Well repleted.   Depression. Zoloft was decreased.    Anemia of chronic dz-stable.   CPT CODE: 40981

## 2012-12-26 ENCOUNTER — Non-Acute Institutional Stay (SKILLED_NURSING_FACILITY): Payer: Medicare Other | Admitting: Internal Medicine

## 2012-12-26 DIAGNOSIS — M1990 Unspecified osteoarthritis, unspecified site: Secondary | ICD-10-CM

## 2012-12-26 DIAGNOSIS — I10 Essential (primary) hypertension: Secondary | ICD-10-CM

## 2012-12-26 DIAGNOSIS — E876 Hypokalemia: Secondary | ICD-10-CM

## 2012-12-26 DIAGNOSIS — M810 Age-related osteoporosis without current pathological fracture: Secondary | ICD-10-CM

## 2012-12-26 NOTE — Progress Notes (Signed)
Patient ID: Alexis Archer, female   DOB: 1914/06/21, 79 y.o.   MRN: [id number]        PROGRESS NOTE  DATE:  12/26/2012   FACILITY: Nursing Home Location: [facility]  LEVEL OF CARE: SNF (31)  Routine Visit  CHIEF COMPLAINT:  Manage hypertension and osteoarthritis.    HISTORY OF PRESENT ILLNESS:  REASSESSMENT OF ONGOING PROBLEM(S):  HTN: Pt 's HTN remains stable.  Denies CP, sob, DOE, pedal edema, headaches, dizziness or visual disturbances.  No complications from the medications currently being used.  Last BP : 132/74,163/63, 115/72, 132/88, 114/73.  ARTHRITIS: Patient's arthritis remains stable.  The patient denies ongoing joint pains, stiffness, swelling, warmth & redness.  No complications reported from the medication(s) currently being used.   PAST MEDICAL HISTORY : Reviewed.  No changes.  CURRENT MEDICATIONS: Reviewed per [facility]  REVIEW OF SYSTEMS:  GENERAL: no change in appetite, no fatigue, no weight changes, no fever, chills or weakness RESPIRATORY: no cough, SOB, DOE, wheezing, hemoptysis CARDIAC: no chest pain, edema or palpitations GI: no abdominal pain, diarrhea, constipation, heart burn, nausea or vomiting  PHYSICAL EXAMINATION  VS:  T 96    P 68     RR 18     BP 114/73     POX %     WT (Lb) 153.2  GENERAL: no acute distress, normal body habitus NECK: supple, trachea midline, no neck masses, no thyroid tenderness, no thyromegaly RESPIRATORY: breathing is even & unlabored, BS CTAB CARDIAC: RRR, no murmur,no extra heart sounds EDEMA/VARICOSITIES:  +1 bilateral lower extremity edema  ARTERIAL: pedal pulses +1  GI: abdomen soft, normal BS, no masses, no tenderness, no hepatomegaly, no splenomegaly PSYCHIATRIC: the patient is alert & oriented to person, affect & behavior appropriate  LABS/RADIOLOGY:  7/14 total protein 5.7 otherwise liver profile normal  5/14 Hb 11.8, mcv 84.5 ow cbc nl, bun 40, cr 1.28 ow bmp nl 04/2012/:  CBC normal.    Glucose 108, BUN 26, creatinine 1.13, otherwise CMP normal.   Magnesium 1.8.  ASSESSMENT/PLAN:  Hypertension.  Stable.  Osteoarthritis.  Denies ongoing pain.    Osteoporosis.  Continue current medications.   Hypokalemia.  Well repleted.   Depression. Stable.  Anemia of chronic dz-stable.   CPT CODE: 47829

## 2013-01-01 ENCOUNTER — Other Ambulatory Visit: Payer: Self-pay

## 2013-01-01 MED ORDER — LORAZEPAM 1 MG PO TABS: ORAL_TABLET | ORAL | Status: AC

## 2013-01-01 NOTE — Telephone Encounter (Signed)
Verified dose and instructions reflect manual request received by nursing home.   

## 2013-01-22 ENCOUNTER — Non-Acute Institutional Stay (SKILLED_NURSING_FACILITY): Payer: Medicare Other | Admitting: Internal Medicine

## 2013-01-22 DIAGNOSIS — E876 Hypokalemia: Secondary | ICD-10-CM

## 2013-01-22 DIAGNOSIS — M1990 Unspecified osteoarthritis, unspecified site: Secondary | ICD-10-CM

## 2013-01-22 DIAGNOSIS — I10 Essential (primary) hypertension: Secondary | ICD-10-CM

## 2013-01-22 DIAGNOSIS — M810 Age-related osteoporosis without current pathological fracture: Secondary | ICD-10-CM

## 2013-01-22 NOTE — Progress Notes (Signed)
Patient ID: Alexis Archer, female   DOB: [birth year], 79 y.o.   MRN: [id number]        PROGRESS NOTE  DATE:  01/22/2013   FACILITY: Nursing Home Location: [facility]  LEVEL OF CARE: SNF (31)  Routine Visit  CHIEF COMPLAINT:  Manage hypertension and osteoarthritis.    HISTORY OF PRESENT ILLNESS:  REASSESSMENT OF ONGOING PROBLEM(S):  HTN: Pt 's HTN remains stable.  Denies CP, sob, DOE, pedal edema, headaches, dizziness or visual disturbances.  No complications from the medications currently being used.  Last BP : 132/74,163/63, 115/72, 132/88, 114/73, 118/68.  ARTHRITIS: Patient's arthritis remains stable.  The patient denies ongoing joint pains, stiffness, swelling, warmth & redness.  No complications reported from the medication(s) currently being used.   PAST MEDICAL HISTORY : Reviewed.  No changes.  CURRENT MEDICATIONS: Reviewed per [facility]  REVIEW OF SYSTEMS:  GENERAL: no change in appetite, no fatigue, no weight changes, no fever, chills or weakness RESPIRATORY: no cough, SOB, DOE, wheezing, hemoptysis CARDIAC: no chest pain, edema or palpitations GI: no abdominal pain, diarrhea, constipation, heart burn, nausea or vomiting  PHYSICAL EXAMINATION  VS:  T 97.6    P 84     RR 20    BP 118/68   POX %     WT (Lb) 155.6  GENERAL: no acute distress, normal body habitus NECK: supple, trachea midline, no neck masses, no thyroid tenderness, no thyromegaly RESPIRATORY: breathing is even & unlabored, BS CTAB CARDIAC: RRR, no murmur,no extra heart sounds EDEMA/VARICOSITIES:  +1 bilateral lower extremity edema  ARTERIAL: pedal pulses +1  GI: abdomen soft, normal BS, no masses, no tenderness, no hepatomegaly, no splenomegaly PSYCHIATRIC: the patient is alert & oriented to person, affect & behavior appropriate  LABS/RADIOLOGY:  7/14 total protein 5.7 otherwise liver profile normal  5/14 Hb 11.8, mcv 84.5 ow cbc nl, bun 40, cr 1.28 ow bmp nl 04/2012/:  CBC  normal.   Glucose 108, BUN 26, creatinine 1.13, otherwise CMP normal.   Magnesium 1.8.  ASSESSMENT/PLAN:  Hypertension.  Stable.  Osteoarthritis.  Denies ongoing pain.    Osteoporosis.  Continue current medications.   Hypokalemia.  Well repleted.   Depression. Stable.  Anemia of chronic dz-stable.  CPT CODE: 40981

## 2013-02-26 ENCOUNTER — Encounter: Payer: Self-pay | Admitting: Internal Medicine

## 2013-02-26 ENCOUNTER — Non-Acute Institutional Stay (SKILLED_NURSING_FACILITY): Payer: Medicare Other | Admitting: Internal Medicine

## 2013-02-26 DIAGNOSIS — I10 Essential (primary) hypertension: Secondary | ICD-10-CM

## 2013-02-26 DIAGNOSIS — E876 Hypokalemia: Secondary | ICD-10-CM

## 2013-02-26 DIAGNOSIS — M1990 Unspecified osteoarthritis, unspecified site: Secondary | ICD-10-CM

## 2013-02-26 DIAGNOSIS — M810 Age-related osteoporosis without current pathological fracture: Secondary | ICD-10-CM

## 2013-02-26 NOTE — Progress Notes (Signed)
Patient ID: Alexis Archer, female   DOB: 1914/08/20, 79 y.o.   MRN: [id number]        PROGRESS NOTE  DATE:  02/26/2013   FACILITY: Nursing Home Location: [facility]  LEVEL OF CARE: SNF (31)  Routine Visit  CHIEF COMPLAINT:  Manage hypertension and osteoarthritis.    HISTORY OF PRESENT ILLNESS:  REASSESSMENT OF ONGOING PROBLEM(S):  HTN: Pt 's HTN remains stable.  Denies CP, sob, DOE, pedal edema, headaches, dizziness or visual disturbances.  No complications from the medications currently being used.  Last BP : 132/74,163/63, 115/72, 132/88, 114/73, 118/68, 160/72.  ARTHRITIS: Patient's arthritis remains stable.  The patient denies ongoing joint pains, stiffness, swelling, warmth & redness.  No complications reported from the medication(s) currently being used.   PAST MEDICAL HISTORY : Reviewed.  No changes.  CURRENT MEDICATIONS: Reviewed per [facility]  REVIEW OF SYSTEMS:  GENERAL: no change in appetite, no fatigue, no weight changes, no fever, chills or weakness RESPIRATORY: no cough, SOB, DOE, wheezing, hemoptysis CARDIAC: no chest pain, edema or palpitations GI: no abdominal pain, diarrhea, constipation, heart burn, nausea or vomiting  PHYSICAL EXAMINATION  VS:  T 98.2    P 50     RR 18   BP 160/72   POX %     WT (Lb) 156.2  GENERAL: no acute distress, normal body habitus NECK: supple, trachea midline, no neck masses, no thyroid tenderness, no thyromegaly RESPIRATORY: breathing is even & unlabored, BS CTAB CARDIAC: RRR, no murmur,no extra heart sounds EDEMA/VARICOSITIES:  +1 bilateral lower extremity edema  ARTERIAL: pedal pulses +1  GI: abdomen soft, normal BS, no masses, no tenderness, no hepatomegaly, no splenomegaly PSYCHIATRIC: the patient is alert & oriented to person, affect & behavior appropriate  LABS/RADIOLOGY:  7/14 total protein 5.7 otherwise liver profile normal  5/14 Hb 11.8, mcv 84.5 ow cbc nl, bun 40, cr 1.28 ow bmp nl 04/2012/:   CBC normal.   Glucose 108, BUN 26, creatinine 1.13, otherwise CMP normal.   Magnesium 1.8.  ASSESSMENT/PLAN:  Hypertension. Blood pressure elevated. Will review a log.  Osteoarthritis.  Denies ongoing pain.    Osteoporosis.  Continue current medications.   Hypokalemia.  Well repleted.   Depression. Stable.  Anemia of chronic dz-stable.  Check CBC and BMP  CPT CODE: 40981

## 2013-04-16 ENCOUNTER — Non-Acute Institutional Stay (SKILLED_NURSING_FACILITY): Payer: Medicare Other | Admitting: Internal Medicine

## 2013-04-16 DIAGNOSIS — R05 Cough: Secondary | ICD-10-CM

## 2013-04-16 DIAGNOSIS — R509 Fever, unspecified: Secondary | ICD-10-CM

## 2013-04-16 DIAGNOSIS — R059 Cough, unspecified: Secondary | ICD-10-CM

## 2013-04-23 ENCOUNTER — Non-Acute Institutional Stay (SKILLED_NURSING_FACILITY): Payer: Medicare Other | Admitting: Internal Medicine

## 2013-04-23 DIAGNOSIS — M1990 Unspecified osteoarthritis, unspecified site: Secondary | ICD-10-CM

## 2013-04-23 DIAGNOSIS — F329 Major depressive disorder, single episode, unspecified: Secondary | ICD-10-CM

## 2013-04-23 DIAGNOSIS — F3289 Other specified depressive episodes: Secondary | ICD-10-CM

## 2013-04-23 DIAGNOSIS — M810 Age-related osteoporosis without current pathological fracture: Secondary | ICD-10-CM

## 2013-04-23 DIAGNOSIS — F32A Depression, unspecified: Secondary | ICD-10-CM

## 2013-04-23 DIAGNOSIS — I10 Essential (primary) hypertension: Secondary | ICD-10-CM

## 2013-04-23 NOTE — Progress Notes (Signed)
Patient ID: Alexis Archer, female   DOB: 11-08-1914, 80 y.o.   MRN: [id number]         PROGRESS NOTE  DATE:  04/23/2013  FACILITY: Nursing Home Location: [facility]  LEVEL OF CARE: SNF (31)  Routine Visit  CHIEF COMPLAINT:  Manage hypertension and osteoarthritis.    HISTORY OF PRESENT ILLNESS:  REASSESSMENT OF ONGOING PROBLEM(S):  HTN: Pt 's HTN remains stable.  Denies CP, sob, DOE, pedal edema, headaches, dizziness or visual disturbances.  She is currently off of her antihypertensives.  Last BP : 132/74,163/63, 115/72, 132/88, 114/73, 118/68, 160/72, 120/64.  ARTHRITIS: Patient's arthritis remains stable.  The patient denies ongoing joint pains, stiffness, swelling, warmth & redness.  No complications reported from the medication(s) currently being used.   PAST MEDICAL HISTORY : Reviewed.  No changes.  CURRENT MEDICATIONS: [facility]  REVIEW OF SYSTEMS:  GENERAL: no change in appetite, no fatigue, no weight changes, no fever, chills or weakness RESPIRATORY: no cough, SOB, DOE, wheezing, hemoptysis CARDIAC: no chest pain, edema or palpitations GI: no abdominal pain, diarrhea, constipation, heart burn, nausea or vomiting  PHYSICAL EXAMINATION  VS:  T 98.2    P 64     RR 18   BP 120/64   POX %     WT (Lb) 153.6  GENERAL: no acute distress, normal body habitus NECK: supple, trachea midline, no neck masses, no thyroid tenderness, no thyromegaly RESPIRATORY: breathing is even & unlabored, BS CTAB CARDIAC: RRR, no murmur,no extra heart sounds EDEMA/VARICOSITIES:  +1 bilateral lower extremity edema  ARTERIAL: pedal pulses +1  GI: abdomen soft, normal BS, no masses, no tenderness, no hepatomegaly, no splenomegaly PSYCHIATRIC: the patient is alert & oriented to person, affect & behavior appropriate  LABS/RADIOLOGY:  7/14 total protein 5.7 otherwise liver profile normal  5/14 Hb 11.8, mcv 84.5 ow cbc nl, bun 40, cr 1.28 ow bmp nl 04/2012/:  CBC  normal.   Glucose 108, BUN 26, creatinine 1.13, otherwise CMP normal.   Magnesium 1.8.  ASSESSMENT/PLAN:  Hypertension. Blood pressure normal off of medications.  Osteoarthritis.  Denies ongoing pain.    Osteoporosis.  Continue current medications.   Depression. Stable.  Anemia of chronic dz-stable.  Check CBC and CMP  CPT CODE: [id number]

## 2013-05-22 NOTE — Progress Notes (Signed)
Patient ID: Alexis HammingDorothy Kratochvil, female   DOB: 06/24/1914, 80 y.o.   MRN: [id number]          PROGRESS NOTE  DATE: 04/16/2013    [facility]  LEVEL OF CARE: SNF (31)  Acute Visit   CHIEF COMPLAINT:  Manage cough and fever.    HISTORY OF PRESENT ILLNESS: I was requested by the staff to assess the patient regarding above problem(s):  Staff report that patient is having a low-grade fever and a nonproductive cough since yesterday.  Patient overall is a poor historian.  Staff do not report respiratory insufficiency.  They cannot identify precipitating or alleviating factors.  There is no temporal relationship.    PAST MEDICAL HISTORY : Reviewed.  No changes.  [facility]: [facility]  REVIEW OF SYSTEMS:  Unobtainable.  Patient is a poor historian.      PHYSICAL EXAMINATION  VS:  T 100.8       P 93      RR 20      BP 147/99     POX 95% room air        GENERAL: no acute distress, normal body habitus EYES: conjunctivae normal, sclerae normal, normal eye lids NECK: supple, trachea midline, no neck masses, no thyroid tenderness, no thyromegaly LYMPHATICS: no LAN in the neck, no supraclavicular LAN RESPIRATORY: poor effort, but no wheezing or crackles   CARDIAC: RRR, no murmur,no extra heart sounds, no edema  ASSESSMENT/PLAN:  Fever.    Cough.    New problems.  Obtain chest x-ray.  Check influenza PCR.  Start Tamiflu 75 mg q.d. for seven days and Mucinex 600 mg b.i.d. for one week.    CPT CODE: [id number]    Angela CoxGayani Y Matti Killingsworth, MD [facility] [phone]

## 2013-05-23 DIAGNOSIS — R059 Cough, unspecified: Secondary | ICD-10-CM | POA: Insufficient documentation

## 2013-05-23 DIAGNOSIS — R509 Fever, unspecified: Secondary | ICD-10-CM | POA: Insufficient documentation

## 2013-05-23 DIAGNOSIS — R05 Cough: Secondary | ICD-10-CM | POA: Insufficient documentation

## 2013-07-30 ENCOUNTER — Non-Acute Institutional Stay (SKILLED_NURSING_FACILITY): Payer: Medicare Other | Admitting: Internal Medicine

## 2013-07-30 ENCOUNTER — Encounter: Payer: Self-pay | Admitting: Internal Medicine

## 2013-07-30 DIAGNOSIS — H409 Unspecified glaucoma: Secondary | ICD-10-CM

## 2013-07-30 DIAGNOSIS — M1990 Unspecified osteoarthritis, unspecified site: Secondary | ICD-10-CM

## 2013-07-30 DIAGNOSIS — F0391 Unspecified dementia with behavioral disturbance: Secondary | ICD-10-CM

## 2013-07-30 DIAGNOSIS — L0291 Cutaneous abscess, unspecified: Secondary | ICD-10-CM

## 2013-07-30 DIAGNOSIS — E876 Hypokalemia: Secondary | ICD-10-CM

## 2013-07-30 DIAGNOSIS — I10 Essential (primary) hypertension: Secondary | ICD-10-CM

## 2013-07-30 DIAGNOSIS — F3289 Other specified depressive episodes: Secondary | ICD-10-CM

## 2013-07-30 DIAGNOSIS — L0390 Cellulitis, unspecified: Secondary | ICD-10-CM | POA: Insufficient documentation

## 2013-07-30 DIAGNOSIS — F329 Major depressive disorder, single episode, unspecified: Secondary | ICD-10-CM

## 2013-07-30 DIAGNOSIS — F03918 Unspecified dementia, unspecified severity, with other behavioral disturbance: Secondary | ICD-10-CM

## 2013-07-30 DIAGNOSIS — F32A Depression, unspecified: Secondary | ICD-10-CM

## 2013-07-30 NOTE — Progress Notes (Signed)
Patient ID: Alexis HammingDorothy Archer, female   DOB: [birth year], 80 y.o.   MRN: [id number]   This is a routine visit.  Level of care skilled.  Facility a half.  Chief complaint -medical management of dementia hypertension glaucoma anemia osteoarthritis.  History of patient is a 80 year old female with the above diagnoses according to nursing staff she is stable-she does have instances of behaviors yelling apparently spitting-she is seen by psychiatric services and they recently started Depakote 125 mg twice a day which apparently is helping according to nursing---  Most acute issue today apparently is some erythema around the skin tear on her lower left leg she is not complaining of any acute pain here she is afebrile.   she does have a history of osteoarthritis this appears to be controlled currently on Tylenol she also has an order for multimeric gel as needed.  In regards glucoma she continues on topical eyedrops.  And osteoporosis she is on calcium supplementation.  She has a listed history of hypertension but is not on any medication recent blood pressures. Stable 121/68-114/60.  Family medical social history as been reviewed her previous progress notes most recently 04/23/2013.  Medications have been reviewed per MAR.  Review of systems obtained from nursing since patient cannot really give review of systems secondary to severe dementia.  In general no fever chills noted.  Respiratory no complaints shortness breath or cough according to nursing.  Cardiac-no complaints of chest pain she has some mild lower extremity edema.  GI-no recent nausea vomiting or complaints of abdominal pain noted apparently no constipation.  Muscle skeletal does complain of osteoarthritic pain especially of her knees apparently this is controlled.  Neurologic no complaints of dizziness headache.  Psych she does have significant dementia with behaviors is seen by psychiatric services as n.  Physical  exam.  Temperature is 98.0 pulse 65 respirations 20 blood pressure 121/68.  In general this is a somewhat frail elderly female in no distress although she is somewhat agitated with exam.  Her skin is warm and dry she does have scattered bruising this appears to be chronic upper and lower extremities a small skin tear on her left upper arm currently covered-also an area on her lower left shin appears to have been a mild skin tear but there is some erythema approximately 2-3 cm surrounding this  There is no drainage bleeding or firmness around there possibly minimally tender to palpation.  Chest is clear to auscultation no labored breathing.  Heart is regular rate and rhythm without murmur gallop or rub.  Abdomen soft nontender positive bowel sounds.  Muscle skeletal is able to move all extremities x4 strength appears appropriate in all extremities although she did not really follow verbal instruction.  She does have 1+ lower extremity edema apparently this is chronic.  Neurologic-limited exam the cranial nerves are intact her speech is clear appears able to move all extremities without lateralizing findings.  Psych she is alert and oriented to person but does not really follow verbal command somewhat agitated with exam today.  Labs.  Jan 22nd 2015.  Sodium 139 potassium 4.7 BUN 30 creatinine 1.0.  WBC 6.0 hemoglobin 13.0 platelets 332.  Albumin 3.3 otherwise liver function tests within normal limits.  Assessment and plan.  #1-cellulitis-appears to have some beginning cellulitis of her lower left leg -- will start Keflex 500 mg twice a day for 7 days and has staff monitor for any progression or drainage--if drainage culture  #2-osteoarthritis-at this point appears to be  controlled on current medications including Tylenol and Voltaren gel.  #3 osteoporosis she is on calcium.  #4 dementia with behaviors she is seen by psychiatric services has been started on Depakote apparently  with some beneficial effect.  #5-anemia of chronic disease-this appears stable we'll update lab.  #6 lower extremity edema apparently this is at baseline. At one point she had been on low dose Lasix she's no longer on this continue to monitor for any changes clinically appears stable.  #7 depression she is on Zoloft at that point appears relatively stable she is followed by psychiatric services.  Glaucoma she continues on topical eyedrops including Latanoprost and combigan  Hypertension-as noted above this appears stable despite not being on any medication  Of note she does have some history apparently of hypokalemia in the past however I suspect this was because she was on Lasix at one point-Will update metabolic panel to ensure stability   [phone]

## 2013-08-11 IMAGING — CT CT CERVICAL SPINE W/O CM
3 of 18 series · 8 of 33 positions shown, 9 images · non-contrast
Comparison: None

CT HEAD

CLINICAL DATA: Status post fall; found on floor, with facial
bruising.  Concern for head, facial or cervical spine injury.

CT HEAD WITHOUT CONTRAST
CT MAXILLOFACIAL WITHOUT CONTRAST
CT CERVICAL SPINE WITHOUT CONTRAST
TECHNIQUE: Multidetector CT imaging of the head, cervical spine,
and maxillofacial structures were performed using the standard
protocol without intravenous contrast. Multiplanar CT image
reconstructions of the cervical spine and maxillofacial structures
were also generated.

[Series 15: facial st · axial · 0.32mm/px · z∈[-248,-132]mm · 2 of 59 slices shown, 3 images]
[im 1/59  soft-tissue]
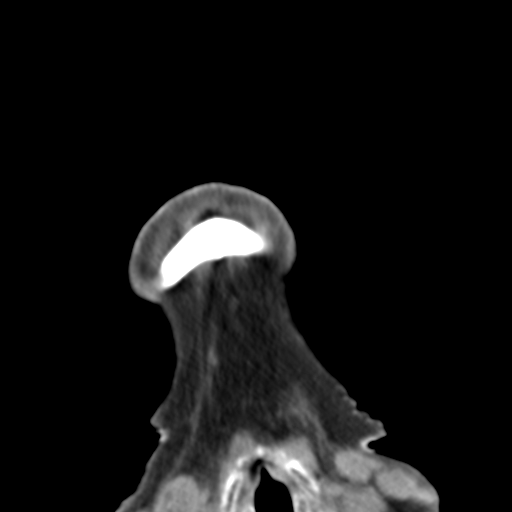
[im 1/59  bone]
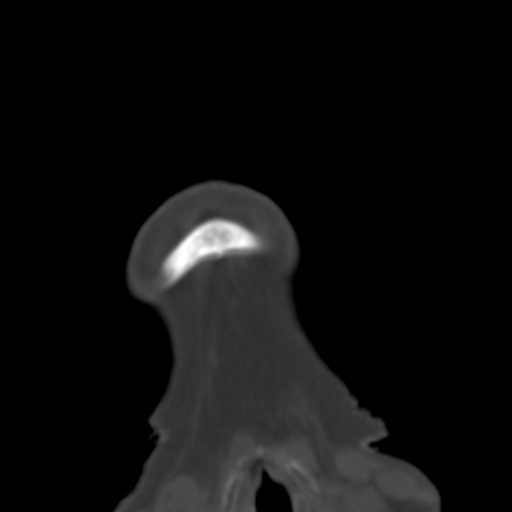
[im 59/59  bone]
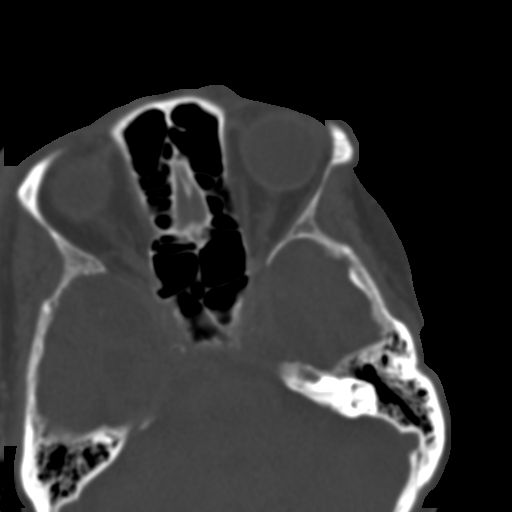

[Series 29: coronal st · coronal · 0.24mm/px · 1 of 76 slices shown]
[im 38/76  bone]
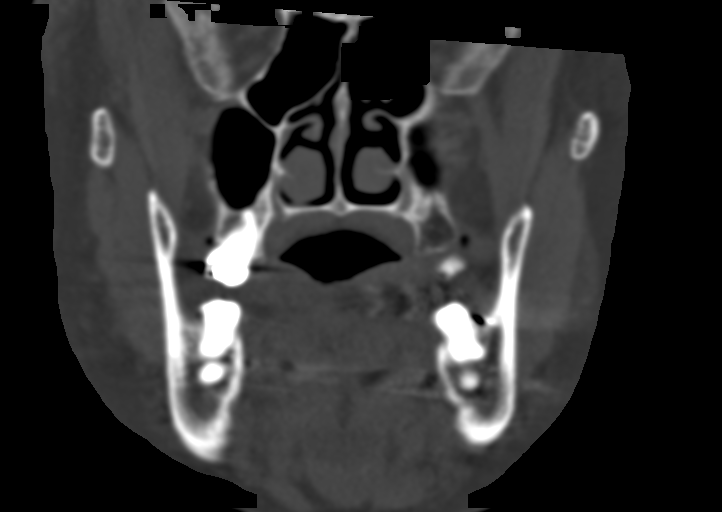

[Series 30: sagittal st · sagittal · 0.24mm/px · 5 of 80 slices shown]
[im 14/80  bone]
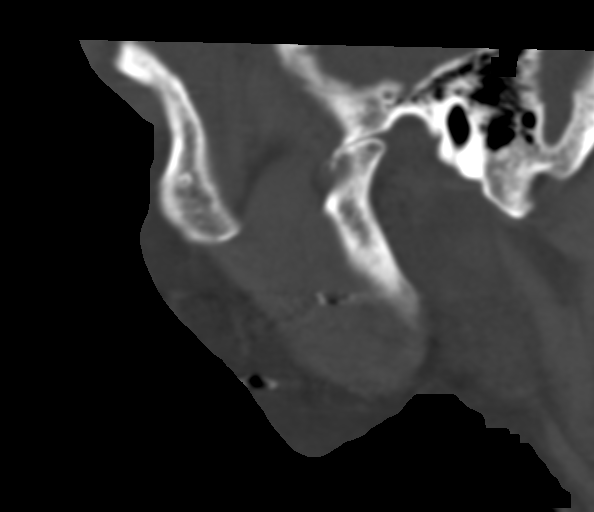
[im 27/80  bone]
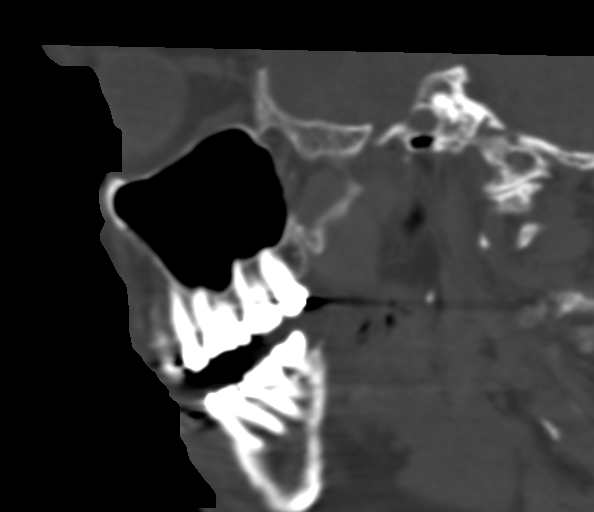
[im 40/80  bone]
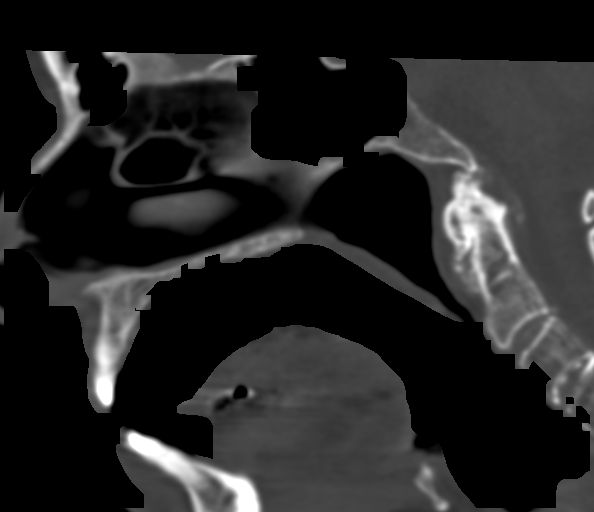
[im 53/80  bone]
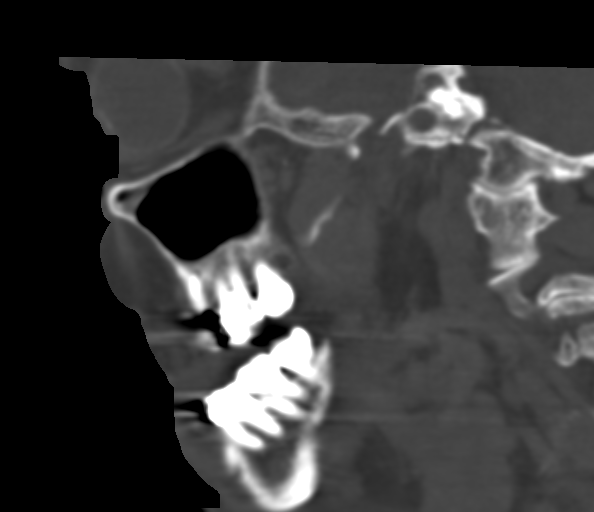
[im 66/80  bone]
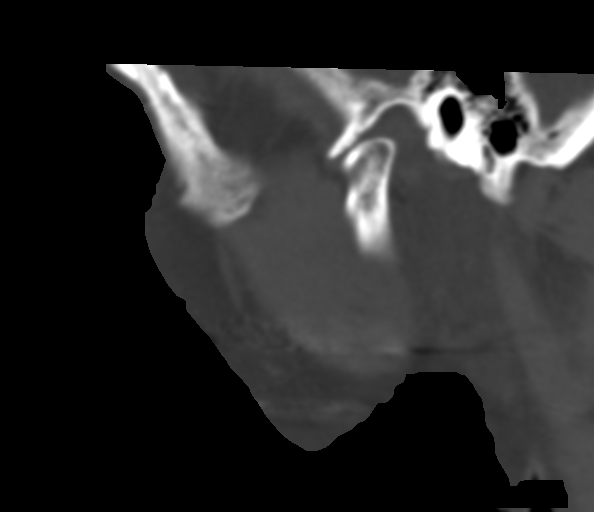

[8 of 33 positions shown; findings below may reference images not displayed]

FINDINGS: There is no evidence of acute infarction, mass lesion, or
intra- or extra-axial hemorrhage on CT.  Evaluation is
significantly suboptimal due to motion artifact.

Prominence of the ventricles and sulci reflects moderate cortical
volume loss.  Diffuse periventricular and subcortical white matter
change likely reflects small vessel ischemic microangiopathy.
Chronic encephalomalacia is noted at the left temporal lobe and
anterior right temporal lobe, on the maxillofacial CT.

The posterior fossa is not well assessed due to motion artifact.
The basal ganglia are unremarkable in appearance.  The cerebral
hemispheres demonstrate grossly normal gray-white differentiation.
No mass effect or midline shift is seen.

There is no evidence of fracture; visualized osseous structures are
unremarkable in appearance.  However, portions of the calvarium are
not imaged on this study; these are better assessed on concurrent
maxillofacial CT images.  The visualized portions of the orbits are
within normal limits.  The paranasal sinuses and mastoid air cells
are well-aerated.  Soft tissue swelling is noted overlying the
right frontal calvarium.
IMPRESSION: 1.  No definite evidence of traumatic intracranial injury; the
study is significantly suboptimal due to motion artifact, and
portions of the brain and calvarium are not imaged.  These areas
are somewhat better characterized on the concurrent maxillofacial
CT.
2.  Soft tissue swelling overlying the right frontal calvarium.
3.  Moderate cortical volume loss and diffuse small vessel ischemic
microangiopathy; chronic encephalomalacia at the left temporal lobe
and anterior right temporal lobe, reflecting remote infarct.

CT MAXILLOFACIAL
FINDINGS: There is no evidence of fracture or dislocation.  The
maxilla and mandible appear intact.  The nasal bone is unremarkable
in appearance.  The visualized dentition demonstrates no acute
abnormality.

There is chronic anterior subluxation at the temporomandibular
joints, more prominent on the right.

The orbits are intact bilaterally.  The paranasal sinuses and
mastoid air cells are clear.

Soft tissue swelling is noted overlying the right frontal calvarium
and surrounding the right orbit.  The parapharyngeal fat planes are
preserved.  The nasopharynx, oropharynx and hypopharynx are
unremarkable in appearance.  The visualized portions of the
valleculae and piriform sinuses are grossly unremarkable.

The parotid and submandibular glands are within normal limits.  No
cervical lymphadenopathy is seen.
IMPRESSION: 1.  No evidence of fracture or dislocation.
2.  Soft tissue swelling overlying the right frontal calvarium and
surrounding the right orbit.
3.  Chronic anterior subluxation at the temporomandibular joints,
more prominent on the right.

CT CERVICAL SPINE
FINDINGS: There is no evidence of acute fracture or subluxation.
Vertebral bodies demonstrate normal height.  There is grade 1
anterolisthesis of C6 on C7.  Degenerative change is noted about
the dens.  There is fusion of the facet joints bilaterally. There
is mild narrowing of the intervertebral disc spaces along the lower
cervical spine.  Prevertebral soft tissues are within normal
limits.

Mild scattered hypodensities and calcifications within the thyroid
gland are nonspecific in appearance; no dominant mass is seen.  The
visualized lung apices are clear.  Mild calcification is noted at
the carotid bifurcations bilaterally.
IMPRESSION: 1.  No evidence of acute fracture or subluxation along the cervical
spine.
2.  Mild degenerative change noted along the cervical spine.
3.  Mild calcification at the carotid bifurcations bilaterally.

## 2013-08-22 ENCOUNTER — Non-Acute Institutional Stay (SKILLED_NURSING_FACILITY): Payer: Medicare Other | Admitting: Internal Medicine

## 2013-08-22 ENCOUNTER — Encounter: Payer: Self-pay | Admitting: Internal Medicine

## 2013-08-22 DIAGNOSIS — I10 Essential (primary) hypertension: Secondary | ICD-10-CM

## 2013-08-22 DIAGNOSIS — R609 Edema, unspecified: Secondary | ICD-10-CM

## 2013-08-22 DIAGNOSIS — F329 Major depressive disorder, single episode, unspecified: Secondary | ICD-10-CM

## 2013-08-22 DIAGNOSIS — M1990 Unspecified osteoarthritis, unspecified site: Secondary | ICD-10-CM

## 2013-08-22 DIAGNOSIS — I499 Cardiac arrhythmia, unspecified: Secondary | ICD-10-CM

## 2013-08-22 DIAGNOSIS — M810 Age-related osteoporosis without current pathological fracture: Secondary | ICD-10-CM

## 2013-08-22 DIAGNOSIS — F0390 Unspecified dementia without behavioral disturbance: Secondary | ICD-10-CM

## 2013-08-22 DIAGNOSIS — F3289 Other specified depressive episodes: Secondary | ICD-10-CM

## 2013-08-22 DIAGNOSIS — H409 Unspecified glaucoma: Secondary | ICD-10-CM

## 2013-08-22 DIAGNOSIS — F32A Depression, unspecified: Secondary | ICD-10-CM

## 2013-08-22 NOTE — Progress Notes (Signed)
Patient ID: Alexis HammingDorothy Archer, female   DOB: 08/07/1914, 80 y.o.   MRN: [id number]   This is a routine visit.  Level of care skilled.  FacilityAF .  Chief complaint -medical management of dementia hypertension glaucoma anemia osteoarthritis.   History of patient is a 80 year old female with the above diagnoses according to nursing staff she is stable-she does have instances of behaviors yelling apparently spitting-she is seen by psychiatric services and they recently started Depakote 125 mg twice a day which apparently is helping according to nursing---   .  she does have a history of osteoarthritis this appears to be controlled currently on Tylenol she also has an order for Voltaren  gel as needed.  In regards glucoma she continues on topical eyedrops.  And osteoporosis she is on calcium supplementation.  She has a listed history of hypertension but is not on any medication recent blood pressures. Stable 132/60-116/70 She does have a skin tear on her right lower leg nursing staff is  monitoring  this and apparently there are no signs of infection   Family medical social history as been reviewed her previous progress notes most recently 04/23/2013--and 07/30/2013 .  Medications have been reviewed per MAR.   Review of systems obtained from nursing since patient cannot really give review of systems secondary to  dementia.   In general no fever chills noted.  Respiratory no complaints shortness breath or cough according to nursing.  Cardiac-no complaints of chest pain she has some mild lower extremity edema.  GI-no recent nausea vomiting or complaints of abdominal pain noted apparently no constipation.  Muscle skeletal does complain of osteoarthritic pain especially of her knees apparently this is controlled.  Neurologic no complaints of dizziness headache.  Psych she does have significant dementia with behaviors is seen by psychiatric services--apparently recently this has been more stable .    Physical exam.  Temperature 98.2 pulse 78 respirations 20 blood pressure 132/60   In general this is a somewhat frail elderly female in no distress .  Her skin is warm and dry --right lower leg skin tears currently covered with dressing nursing staff has followed this and states there is no sign of infection drainage or surrounding erythema .  Chest is clear to auscultation no labored breathing.  Heart is regular--irregular rate and rhythm without murmur gallop or rub.  Abdomen soft nontender positive bowel sounds.  Muscle skeletal is able to move all extremities x4 strength appears appropriate in all extremities although she did not really follow verbal instruction.  She does have 1+ lower extremity edema apparently this is chronic.  Neurologic-limited exam the cranial nerves are intact her speech is clear appears able to move all extremities without lateralizing findings.  Psych she is alert and oriented to person but does not really follow verbal commands .  Labs  08/02/2013.  WBC 5.7 hemoglobin 12.7 platelets 267.  Sodium 138 potassium 4.5 BUN 29 creatinine 1.0.  .  Jan 22nd 2015 .  Sodium 139 potassium 4.7 BUN 30 creatinine 1.0.  WBC 6.0 hemoglobin 13.0 platelets 332.  Albumin 3.3 otherwise liver function tests within normal limits.   Assessment and plan .    #1-osteoarthritis-at this point appears to be controlled on current medications including Tylenol and Voltaren gel.  #2 osteoporosis she is on calcium.  #3 dementia with behaviors she is seen by psychiatric services has been started on Depakote apparently with some beneficial effect.  #4-anemia of chronic disease-this appears stable per recent CBC #5  lower extremity edema apparently this is at baseline. At one point she had been on low dose Lasix she's no longer on this continue to monitor for any changes clinically appears stable.  #6 depression she is on Zoloft at that point appears relatively stable she is  followed by psychiatric services  #7-some irregularity on cardiac exam-we'll order a EKG clinically she does not appear to be unstable certainly--would like to rule out any arrhythmia .  Glaucoma she continues on topical eyedrops including Latanoprost and combigan  Hypertension-as noted above this appears stable despite not being on any medication  Of note she does have some history apparently of hypokalemia in the past however I suspect this was because she was on Lasix at one point-recent metabolic panel showed stability   [phone]

## 2013-08-29 ENCOUNTER — Non-Acute Institutional Stay (SKILLED_NURSING_FACILITY): Payer: Medicare Other | Admitting: Internal Medicine

## 2013-08-29 DIAGNOSIS — I4891 Unspecified atrial fibrillation: Secondary | ICD-10-CM

## 2013-08-29 NOTE — Progress Notes (Signed)
Patient ID: Chiara Lamons, female   DOB: [birth year], 80 y.o.   MRN: [id number]    This is an acute visit.  Level of care skilled.  Facility AF.  Chief complaint-.  Acute visit secondary followup arrhythmia.  History of present illness.  Patient is a 80 year old female who I saw for a routine visit recently-I did note some irregular heartbeats and ordered an EKG which has come back showing suspected atrial fibrillation-.  Clinically she appears stable continues to ambulate about the facility but no complaints of chest pain or shortness of breath or palpitations.  She is quite a poor historian secondary to dementia.  She is not on any rate limiting agent also is not on anticoagulation.  I did talk with her daughter who does not recall any history of GI bleeding rectal bleeding apparently some distant history of ulcers in the past.  Family medical social history as been reviewed.  Medications have been reviewed per MAR.  Review of systems.  Largely unobtainable secondary to severe dementia but nursing staff has not noted any complaints of shortness of breath chest pain or palpitations or increased weakness or dizziness.  Physical exam.  Temperature is 97.4 pulse 80 respirations 18 blood pressure 142/85.  In general this is a somewhat frail elderly female in no distress sitting comfortably in her wheelchair she is alert but somewhat confused with some difficulty following verbal commands.  Her skin is warm and dry.  Chest is clear to auscultation no labored breathing.  Heart is regular irregular rate and rhythm without murmur gallop or rub she has trace lower extremity edema.  Labs.  08/02/2013.  WBC 5.7 hemoglobin 12.7 platelets 267.  Sodium 138 potassium 4.5 BUN 29 creatinine 1.0     Sodium 138 potassium 4.5 BUN 29 creatinine 1.0.    Assessment and plan.  #1-atrial fibrillation apparently new diagnosis she appears clinically stable at this point does not  appear to need a rate limiting agent pulses I do not see our greater than 100 although on EKG it was just over 100 at 101 this does not appear to be consistent-continue to monitor this for now will start enteric-coated aspirin  Also update BMP and also will obtain a TSH.--Rule out any electrolyte or metabolic abnormality.  [id number] note greater than 25 minutes spent assessing patient discussing her status with nursing staff-as well as discussion with her daughter via phone   .

## 2013-08-30 DIAGNOSIS — I4891 Unspecified atrial fibrillation: Secondary | ICD-10-CM | POA: Insufficient documentation

## 2013-10-01 ENCOUNTER — Non-Acute Institutional Stay (SKILLED_NURSING_FACILITY): Payer: Medicare Other | Admitting: Internal Medicine

## 2013-10-01 ENCOUNTER — Encounter: Payer: Self-pay | Admitting: Internal Medicine

## 2013-10-01 DIAGNOSIS — F03918 Unspecified dementia, unspecified severity, with other behavioral disturbance: Secondary | ICD-10-CM

## 2013-10-01 DIAGNOSIS — N289 Disorder of kidney and ureter, unspecified: Secondary | ICD-10-CM

## 2013-10-01 DIAGNOSIS — F329 Major depressive disorder, single episode, unspecified: Secondary | ICD-10-CM

## 2013-10-01 DIAGNOSIS — R609 Edema, unspecified: Secondary | ICD-10-CM

## 2013-10-01 DIAGNOSIS — H409 Unspecified glaucoma: Secondary | ICD-10-CM

## 2013-10-01 DIAGNOSIS — F0391 Unspecified dementia with behavioral disturbance: Secondary | ICD-10-CM

## 2013-10-01 DIAGNOSIS — M1990 Unspecified osteoarthritis, unspecified site: Secondary | ICD-10-CM

## 2013-10-01 DIAGNOSIS — F3289 Other specified depressive episodes: Secondary | ICD-10-CM

## 2013-10-01 DIAGNOSIS — I4891 Unspecified atrial fibrillation: Secondary | ICD-10-CM

## 2013-10-01 DIAGNOSIS — I10 Essential (primary) hypertension: Secondary | ICD-10-CM

## 2013-10-01 DIAGNOSIS — F32A Depression, unspecified: Secondary | ICD-10-CM

## 2013-10-01 DIAGNOSIS — M810 Age-related osteoporosis without current pathological fracture: Secondary | ICD-10-CM

## 2013-10-01 NOTE — Progress Notes (Signed)
Patient ID: Alexis Archer, female   DOB: 06-06-1914, 80 y.o.   MRN: [id number]   This is a routine visit.  Level of care skilled.  FacilityAF  .  Chief complaint -medical management of dementia hypertension glaucoma anemia osteoarthritsi -a fib.   History of patient is a 80 year old female with the above diagnoses according to nursing staff she is stable-she does have instances of behaviors yelling apparently spitting-she is seen by psychiatric services and they recently increased her Depakote to 250 mg twice a day ---  .  she does have a history of osteoarthritis this appears to be controlled currently on Tylenol she also has an order for Voltaren gel as needed.  In regards glucoma she continues on topical eyedrops.  And osteoporosis she is on calcium supplementation.  She has a listed history of hypertension but is not on any medication recent blood pressures. Stable 1 105/79-121/71.  On last routine visit she was noted to have some irregular rhythm-EKG showed suspected atril fib she has been started on aspirin and this appears to be rate controlled her pulse is around 70 this evening I see listed pulse is a 78 and 7  She does have a skin tear on her right lower leg nursing staff is monitoring this and apparently there are no signs of infection   Family medical social history as been reviewed her previous progress notes most recently 04/23/2013--and 07/30/2013 as well as 08/29/2013   .  Medications have been reviewed per MAR .  Review of systems obtained from nursing since patient cannot really give review of systems secondary to dementia.  In general no fever chills noted.  Respiratory no complaints shortness breath or cough according to nursing.  Cardiac-no complaints of chest pain she has some mild lower extremity edema.  GI-no recent nausea vomiting or complaints of abdominal pain noted apparently no constipation.  Muscle skeletal does complain of osteoarthritic pain especially of  her knees apparently this is controlled.  Neurologic no complaints of dizziness headache.  Psych she does have significant dementia with behaviors is seen by psychiatric services--apparently recently this has been more stable although apparently she does have behaviors at times  .  Physical exam.  Temperature 97.7 pulse 78 respirations 18 blood pressure 105/79  In general this is a somewhat frail elderly female in no distress .  Her skin is warm and dry --right lower leg skin tears currently covered with dressing nursing staff has followed this and states there is no sign of infection drainage or surrounding erythema  .  Chest is clear to auscultation no labored breathing.  Heart is regular--irregular rate and rhythm without murmur gallop or rub--she has what appears to be baseline lower extremity edema-I do not really appreciate any significant sacral edema.  Abdomen soft nontender positive bowel sounds.  Muscle skeletal is able to move all extremities x4 strength appears appropriate in all extremities although she did not really follow verbal instruction.  She does have 1+ lower extremity edema apparently this is chronic.  Neurologic-limited exam the cranial nerves are intact her speech is clear appears able to move all extremities without lateralizing findings.  Psych she is alert and oriented to person but does not really follow verbal commands  .   Labs   09/24/2013.  Sodium 139 potassium 4.8 BUN 30 creatinine 1.4.  6-09- 2015.  Sodium 138 potassium 5.1 BUN 23 creatinine 0.8.  06/first 2015.  TSH is 2.244   08/02/2013.  WBC 5.7 hemoglobin 12.7 platelets  267.  Sodium 138 potassium 4.5 BUN 29 creatinine 1.0.  .  Jan 22nd 2015  .  Sodium 139 potassium 4.7 BUN 30 creatinine 1.0.  WBC 6.0 hemoglobin 13.0 platelets 332.  Albumin 3.3 otherwise liver function tests within normal limits .  Assessment and plan  .  #1-osteoarthritis-at this point appears to be controlled on  current medications including Tylenol  #2 osteoporosis she is on calcium.  #3 dementia with behaviors she is seen by psychiatric services has been started on Depakote apparently with some beneficial effect. --Will update Depakote level and liver function tests #4-anemia of chronic disease this has been stable in the past Will update   #5 lower extremity edema apparently this is at baseline. At one point she had been on low dose Lasix she's no longer on this continue to monitor for any changes clinically appears stable.  #6 depression--she was on Zoloft but this was discontinued by psychiatric services-again they are following this  #7-A. fib-this appears to be rate controlled she is on aspirin for anticoagulation-again will update CBC since she has been started on aspirin   . 8-- Glaucoma she continues on topical eyedrops including Latanoprost and combigan  9  Hypertension-as noted above this appears stable despite not being on any medication    10-renal insufficiency-appears her creatinine has trended up a bit compared to lab done earlier this month-will write order to encourage fluids I spoke to patient and staff about this as well-Will update metabolic panel next lab day   [id number]

## 2013-11-29 ENCOUNTER — Non-Acute Institutional Stay (SKILLED_NURSING_FACILITY): Payer: Medicare Other | Admitting: Internal Medicine

## 2013-11-29 ENCOUNTER — Encounter: Payer: Self-pay | Admitting: Internal Medicine

## 2013-11-29 DIAGNOSIS — I10 Essential (primary) hypertension: Secondary | ICD-10-CM

## 2013-11-29 DIAGNOSIS — I482 Chronic atrial fibrillation, unspecified: Secondary | ICD-10-CM

## 2013-11-29 DIAGNOSIS — R233 Spontaneous ecchymoses: Secondary | ICD-10-CM | POA: Insufficient documentation

## 2013-11-29 DIAGNOSIS — N289 Disorder of kidney and ureter, unspecified: Secondary | ICD-10-CM

## 2013-11-29 DIAGNOSIS — M1990 Unspecified osteoarthritis, unspecified site: Secondary | ICD-10-CM

## 2013-11-29 DIAGNOSIS — R238 Other skin changes: Secondary | ICD-10-CM

## 2013-11-29 DIAGNOSIS — I4891 Unspecified atrial fibrillation: Secondary | ICD-10-CM

## 2013-11-29 DIAGNOSIS — M810 Age-related osteoporosis without current pathological fracture: Secondary | ICD-10-CM

## 2013-11-29 NOTE — Progress Notes (Signed)
Patient ID: Alexis Archer, female   DOB: 1915/02/26, 80 y.o.   MRN: [id number]   This is a routine visit.  Level of care skilled.  FacilityAF  .  Chief complaint -medical management of dementia hypertension glaucoma anemia osteoarthritsi -a fib.   History of patient is a 80 year old female with the above diagnoses according to nursing staff she is stable-she does have instances of behaviors yelling apparently spitting-she is seen by psychiatric services and they recently increased her Depakote to 250 mg twice a day ---  .  she does have a history of osteoarthritis this appears to be controlled currently on Tylenol she also has an order for Voltaren gel as needed.  In regards glucoma she continues on topical eyedrops.  And osteoporosis she is on calcium supplementation.  She has a listed history of hypertension but is not on any medication --appears stable recent BP-117/70 .  Recently on a routine visit-- she was noted to have some irregular rhythm-EKG showed suspected atril fib she has been started on aspirinEC 81 mg and this appears to be rate controlled   S she continues to have some chronic bruising most prominently left thigh area today    Family medical social history as been reviewed her previous progress notes most recently  10/01/2013--  .  Medications have been reviewed per MAR  .  Review of systems obtained from nursing since patient cannot really give review of systems secondary to dementia.  In general no fever chills noted Skin-as noted appears to have some  bruising especially left lower extremity.  Respiratory no complaints shortness breath or cough according to nursing.  Cardiac-no complaints of chest pain she has some mild lower extremity edema.  GI-no recent nausea vomiting or complaints of abdominal pain noted apparently no constipation.  Muscle skeletal does complain of osteoarthritic pain especially of her knees apparently this is controlled.  Neurologic no  complaints of dizziness headache.  Psych she does have significant dementia with behaviors is seen by psychiatric services--apparently recently this has been more stable although apparently she does have behaviors at times  .  Physical exam.  Temperature 96.9-pulse 63-respirations 20-blood pressure 117/70 In general this is a somewhat frail elderly female in no distress .  Her skin is warm and dry -he does have dry dressing applied over the left lower arm skin tear-she does have some chronic bruising this is most noticeable left lower extremity the shin area as well as the thigh area this is violaceous bruising   .  Chest is clear to auscultation no labored breathing.  Heart is regular--irregular rate and rhythm without murmur gallop or rub--she has what appears to be baseline lower extremity edema-I do not really appreciate any significant sacral edema.  Abdomen soft nontender positive bowel sounds.  Muscle skeletal is able to move all extremities x4 strength appears appropriate in all extremities although she did not really follow verbal instruction.  She does have 1+ lower extremity edema apparently this is chronic.  Neurologic-limited exam the cranial nerves are intact her speech is clear appears able to move all extremities without lateralizing findings.  Psych she is alert and oriented to person but does not really follow verbal commands  .  Labs   October 11 2013.  WBC 6.5 hemoglobin 11.7 platelets 239.  10/03/2013.  WBC 6.6 hemoglobin 11.9 platelets 235.  Rhinitis 2015.  Sodium 140 potassium 4.5 BUN 28 crit oh 0.9.  10/03/2013.  Liver function tests within normal limits except albumin of  3.1.  Depakote level-31.9.   09/24/2013.  Sodium 139 potassium 4.8 BUN 30 creatinine 1.4.  6-09- 2015.  Sodium 138 potassium 5.1 BUN 23 creatinine 0.8.  06/first 2015.  TSH is 2.244  08/02/2013.  WBC 5.7 hemoglobin 12.7 platelets 267.  Sodium 138 potassium 4.5 BUN 29 creatinine 1.0.    .  Jan 22nd 2015  .  Sodium 139 potassium 4.7 BUN 30 creatinine 1.0.  WBC 6.0 hemoglobin 13.0 platelets 332.  Albumin 3.3 otherwise liver function tests within normal limits  .  Assessment and plan  .  #1-osteoarthritis-at this point appears to be controlled on current medications including Tylenol   #2 osteoporosis she is on calcium.   #3 dementia with behaviors she is seen by psychiatric services has been started on Depakote apparently with some beneficial effect. --Will update Depakote level and liver function tests   #4-anemia of chronic disease this has been stable in the past Will update   #5 lower extremity edema apparently this is at baseline. At one point she had been on low dose Lasix she's no longer on this continue to monitor for any changes clinically appears stable.   #6 depression--she was on Zoloft but this was discontinued by psychiatric services-again they are following this   #7-A. fib-this appears to be rate controlled she is on aspirin for anticoagulation--  I do note the bruising she has some history of this-I did discuss this with Dr. Lyn Hollingshead and at this point we'll continue the aspirin secondary to her atrial fibrillation again this is a challenging balancing situation--with history of bruising versus anticouagulation need with hx of afib     .  Glaucoma she continues on topical eyedrops including Latanoprost and combigan  9  Hypertension-as noted above this appears stable despite not being on any medication   10-renal insufficiency- At this point appears stable with most recent creatinine 0.9 BUN of 28 which appears to be relatively baseline-Will update  [phone]

## 2013-12-11 ENCOUNTER — Encounter: Payer: Self-pay | Admitting: Internal Medicine

## 2013-12-11 ENCOUNTER — Non-Acute Institutional Stay (SKILLED_NURSING_FACILITY): Payer: Medicare Other | Admitting: Internal Medicine

## 2013-12-11 DIAGNOSIS — R479 Unspecified speech disturbances: Secondary | ICD-10-CM | POA: Insufficient documentation

## 2013-12-11 DIAGNOSIS — R4789 Other speech disturbances: Secondary | ICD-10-CM

## 2013-12-11 DIAGNOSIS — I482 Chronic atrial fibrillation, unspecified: Secondary | ICD-10-CM

## 2013-12-11 DIAGNOSIS — R251 Tremor, unspecified: Secondary | ICD-10-CM

## 2013-12-11 DIAGNOSIS — I4891 Unspecified atrial fibrillation: Secondary | ICD-10-CM

## 2013-12-11 DIAGNOSIS — R259 Unspecified abnormal involuntary movements: Secondary | ICD-10-CM

## 2013-12-11 NOTE — Progress Notes (Signed)
Patient ID: Alexis Archer, female   DOB: 1914/09/15, 80 y.o.   MRN: [id number]   This is an acute visit.  Local care skilled.  Facility AF.  Chief complaint-acute visit secondary to question tremors-altered speech.  History of present illness.  Patient is a 80 year old female with been a relatively long-term care resident here-she does have a history of significant dementia as well as a history of atrial fibrillation she is on aspirin or anticoagulation She also has osteoarthritis as well as osteoporosis.  Nursing staff has left a note saying family thought she had tremor-and also was speaking differently.  It appears her vital signs have been stable-and according to nursing staff appears at this time to be at her baseline confused but quite talkative they have not noted any slurred speech or persistent tremor.  Family medical social history has been reviewed her previous progress notes including all his [age] and history and physical on 12/02/2009.  Medications have been reviewed per [facility] of note she is now on Depakote per psychiatric services for mood stabilization with her history of dementia-also on aspirin for anticoagulation with relatively recent diagnosis of atrial fibrillation.  Review of systems obtained from nursing since patient cannot really give review of systems secondary to dementia.  In general no fever chills noted  Skin-as noted appears to have some bruising and appears to be chronic.  Respiratory no complaints shortness breath or cough according to nursing.  Cardiac-no complaints of chest pain she has some mild lower extremity edema.  GI-no recent nausea vomiting or complaints of abdominal pain noted apparently no constipation.  Muscle skeletal does complain of osteoarthritic pain especially of her knees apparently this is controlled.  Neurologic no complaints of dizziness headache--family apparently has noted occasional tremor.  Psych she does have significant  dementia with behaviors is seen by psychiatric services--apparently recently this has been more stable although apparently she does have behaviors at times  .  Physical exam.  Temperature 97.4 pulse 64 respirations 20 blood pressures recently 125/89-118/58 In general this is a somewhat frail elderly female in no distress sitting comfortably in her wheelchair-she is slightly anxious appearing and fairly talkative which is her baseline .  Her skin is warm and dry -continues with chronic bruising of her  extremities bilaterally   .  Chest is clear to auscultation no labored breathing.  Heart is regular--irregular rate and rhythm without murmur gallop or rub--she has what appears to be baseline lower extremity edema-  Abdomen soft nontender positive bowel sounds.  Muscle skeletal is able to move all extremities x4 strength appears appropriate in all extremities although she did not really follow verbal instruction.  She does have 1+ lower extremity edema apparently this is chronic.  Neurologic- the cranial nerves are  grossly intact her speech is clear appears able to move all extremities without lateralizing findings-baseline strength-she is able to propel herself in a wheelchair at baseline-I do not note a tremor although this tremor certainly could be intermittent.   Psych she is alert and oriented to person but does not really follow verbal commands--this is her baseline  .  Labs  October 11 2013.  WBC 6.5 hemoglobin 11.7 platelets 239.  10/03/2013.  WBC 6.6 hemoglobin 11.9 platelets 235.  Rhinitis 2015.  Sodium 140 potassium 4.5 BUN 28 crit oh 0.9.  10/03/2013.  Liver function tests within normal limits except albumin of 3.1.  Depakote level-31.9.  09/24/2013.  Sodium 139 potassium 4.8 BUN 30 creatinine 1.4.  6-09- 2015.  Sodium 138 potassium 5.1 BUN 23 creatinine 0.8.  06/first 2015.  TSH is 2.244  08/02/2013.  WBC 5.7 hemoglobin 12.7 platelets 267.  Sodium 138 potassium 4.5 BUN 29  creatinine 1.0.  .  Jan 22nd 2015  .  Sodium 139 potassium 4.7 BUN 30 creatinine 1.0.  WBC 6.0 hemoglobin 13.0 platelets 332.  Albumin 3.3 otherwise liver function tests within normal limits  .  Assessment and plan  .  #1--question intermittent tremor-speech abnormality-this afternoon patient appears to be at her baseline her speech is baseline confused but clear and intelligible-did not note any tremor-well monitor this with vital signs and neuro checks every shift for 72 hours to monitor for any recurrent status also will update lab work including a TSH Depakote level CBC and metabolic panel-   #2 dementia with behaviors she is seen by psychiatric services has been started on Depakote apparently with some beneficial effect. -i    #3-A. fib-this appears to be rate controlled she is on aspirin for anticoagulation--  I do note the bruising she has some history of this-I did discuss this with Dr. Lyn Hollingshead previously we'll continue the aspirin secondary to her atrial fibrillation again this is a challenging balancing situation--with history of bruising versus anticouagulation need with hx of afib  .     [id number]

## 2014-01-28 ENCOUNTER — Encounter: Payer: Self-pay | Admitting: Internal Medicine

## 2014-01-28 ENCOUNTER — Non-Acute Institutional Stay (SKILLED_NURSING_FACILITY): Payer: Medicare Other | Admitting: Internal Medicine

## 2014-01-28 DIAGNOSIS — I10 Essential (primary) hypertension: Secondary | ICD-10-CM

## 2014-01-28 DIAGNOSIS — F03918 Unspecified dementia, unspecified severity, with other behavioral disturbance: Secondary | ICD-10-CM

## 2014-01-28 DIAGNOSIS — I482 Chronic atrial fibrillation, unspecified: Secondary | ICD-10-CM

## 2014-01-28 DIAGNOSIS — R609 Edema, unspecified: Secondary | ICD-10-CM

## 2014-01-28 DIAGNOSIS — R238 Other skin changes: Secondary | ICD-10-CM

## 2014-01-28 DIAGNOSIS — N289 Disorder of kidney and ureter, unspecified: Secondary | ICD-10-CM

## 2014-01-28 DIAGNOSIS — R233 Spontaneous ecchymoses: Secondary | ICD-10-CM

## 2014-01-28 DIAGNOSIS — F0391 Unspecified dementia with behavioral disturbance: Secondary | ICD-10-CM

## 2014-01-28 NOTE — Progress Notes (Signed)
Patient ID: Ferman HammingDorothy Archer, female   DOB: 05-06-1914, 80 y.o.   MRN: [id number]   This is an acute visit.  Level of care skilled.  Facility is [facility].                                                  Not recorded       .  Chief complaint -medical management of dementia hypertension glaucoma anemia osteoarthritsi -a fib.--acute visit secondary to left hand edema  History of patient is a 80 year old female with the above diagnoses according to nursing staff she is stable-she does have instances of behaviors yelling apparently spitting-she is seen by psychiatric services and they recently increased her Depakote to 250 mg twice a day ---  .  she does have a history of osteoarthritis this appears to be controlled currently on Tylenol she also has an order for Voltaren gel as needed.  In regards glucoma she continues on topical eyedrops.  And osteoporosis she is on calcium supplementation.  She has a listed history of hypertension but is not on any medication --appears stable recent BP-112/78 .  Recently on a routine visit-- she was noted to have some irregular rhythm-EKG showed suspected atril fib she has been started on aspirinEC 81 mg and this appears to be rate controlled   S she continues to have some chronic bruising  Nursing staff has noted some edema of her left hand there apparently is been no recent history of trauma she does not really complain of pain with this    Family medical social history as been reviewed her previous progress notes most recently 10/01/2013--  .  Medications have been reviewed per MAR  .  Review of systems obtained from nursing since patient cannot really give review of systems secondary to dementia.  In general no fever chills noted Skin-as noted appears to have some bruising especially lower extremity.  Respiratory no complaints shortness breath or cough according to nursing.  Cardiac-no complaints of chest  pain she has some mild lower extremity and some edema of her left hand.  GI-no recent nausea vomiting or complaints of abdominal pain noted apparently no constipation.  Muscle skeletal does complain of osteoarthritic pain especially of her knees apparently this is controlled.  Neurologic no complaints of dizziness headache.  Psych she does have significant dementia with behaviors is seen by psychiatric services--apparently recently this has been more stable although apparently she does have behaviors at times  .  Physical exam. temperature 97.0 pulse 77 respirations 18 blood pressure 112/78-122/66-143/97- In general this is a somewhat frail elderly female in no distress .  Her skin is warm and dry --she does have some chronic bruising--I do note some violaceous changes to her left arm proximal hand area this is edematous cool to touch does not appear to be tender    .  Chest is clear to auscultation no labored breathing.  Heart is regular--irregular rate and rhythm without murmur gallop or rub--she has what appears to be baseline lower extremity edema-.  Abdomen soft nontender positive bowel sounds.  Muscle skeletal is able to move all extremities x4 strength appears appropriate in all extremities although she did not really follow verbal instruction. she has a edema of her left hand as noted above radial pulse is intact--grip strength appears to be fairly strong although  somewhat difficult to fully tell since patient does have dementia She does have 1+ lower extremity edema apparently this is chronic.  Neurologic-limited exam the cranial nerves are intact her speech is clear appears able to move all extremities without lateralizing findings.  Psych she is alert and oriented to person but does not really follow verbal commands  .  Labs   12/14/2013. WBC 5.7 hemoglobin 11.  October 11 2013.  WBC 6.5 hemoglobin 11.7 platelets 239.  10/03/2013.  WBC 6.6 hemoglobin 11.9  platelets 235.  Rhinitis 2015.  Sodium 140 potassium 4.5 BUN 28 crit oh 0.9.  10/03/2013.  Liver function tests within normal limits except albumin of 3.1.  Depakote level-31.9.   09/24/2013.  Sodium 139 potassium 4.8 BUN 30 creatinine 1.4.  6-09- 2015.  Sodium 138 potassium 5.1 BUN 23 creatinine 0.8.  06/first 2015.  TSH is 2.244  08/02/2013.  WBC 5.7 hemoglobin 12.7 platelets 267.  Sodium 138 potassium 4.5 BUN 29 creatinine 1.0.  .  Jan 22nd 2015  .  Sodium 139 potassium 4.7 BUN 30 creatinine 1.0.  WBC 6.0 hemoglobin 13.0 platelets 332.  Albumin 3.3 otherwise liver function tests within normal limits  .  Assessment and plan  .  #1-osteoarthritis-at this point appears to be controlled on current medications including Tylenol  #2 osteoporosis she is on calcium.  #3 dementia with behaviors she is seen by psychiatric services has been started on Depakote apparently with some beneficial effect. --Will update Depakote level and liver function tests  #4-anemia of chronic disease this has been stable in the past Will update  #5 lower extremity edema apparently this is at baseline. At one point she had been on low dose Lasix she's no longer on this continue to monitor for any changes clinically appears stable.  #6 depression--she was on Zoloft but this was discontinued by psychiatric services-again they are following this   #7-A. fib-this appears to be rate controlled she is on aspirin for anticoagulation--  I do note the bruising she has some history of this-I did  previously discuss this with Dr. Lyn HollingsheadAlexander and at this point we'll continue the aspirin secondary to her atrial fibrillation again this is a challenging balancing situation--with history of bruising versus anticouagulation need with hx of afib   .  Glaucoma she continues on topical eyedrops including Latanoprost and combigan  9  Hypertension-as noted above this appears stable  despite not being on any medication   10-renal insufficiency- At this point appears stable with most recent creatinine 0.9 BUN of 28 which appears to be relatively baseline-Will update  #11-left hand edema-this does not appear to be painful capillary refill is intact radial pulse intact-will order x-ray also a venous Doppler to rule out any clot etiology  [phone]

## 2014-02-14 ENCOUNTER — Non-Acute Institutional Stay (SKILLED_NURSING_FACILITY): Payer: Medicare Other | Admitting: Internal Medicine

## 2014-02-14 DIAGNOSIS — I482 Chronic atrial fibrillation, unspecified: Secondary | ICD-10-CM

## 2014-02-14 DIAGNOSIS — R079 Chest pain, unspecified: Secondary | ICD-10-CM

## 2014-02-14 DIAGNOSIS — F0391 Unspecified dementia with behavioral disturbance: Secondary | ICD-10-CM

## 2014-02-14 DIAGNOSIS — F03918 Unspecified dementia, unspecified severity, with other behavioral disturbance: Secondary | ICD-10-CM

## 2014-02-14 NOTE — Progress Notes (Signed)
Patient ID: Alexis HammingDorothy Archer, female   DOB: 03/11/1915, 80 y.o.   MRN: [id number]  this is an acute visit.  Level care skilled.  Facility [facility]. Chief complaintacute visit secondary to chest pain new-onset.  History of present illness.  Patient is a 80 year old female who has been a long-term resident at this facility and has been relatively stable.  She does have significant dementia and is followed by psychiatric services which recently adjusted her Depakote.  She also has a history of atrial fibrillation she is on aspirin.  Rate appears to be controlled largely.  She has a listed history of hypertension but is not on any medications for this and this has been stable.  Apparently patient is complaining of chest pain new-onset-this is a rare complaint for her she is somewhat gripping the left side of her chest and when asked keeps pointing to that area.  Initial vital signs were stable except for an oxygen level in the 70s-this fairly quickly rose to the high 80s on 2 L of oxygen.  She does not appear to be diaphoretic or in acute distress but this is a new presentation for her.  Family medical social history as been reviewed most recently progress note on 01/28/2014.  Medications have been reviewed per MAR.  Review of systems this is quite limited secondary to dementia but she is complaining continually of chest pain which is unlike her-she is somewhat gripping the left side of her chest pointing to the area of discomfort.--she does not appear to be complaining of any shortness of breath  Physical exam.  Temperature is pending pulse is 64 respirations 20 blood pressure 140/74 O2 saturation as noted above currently in the high 80s on oxygen.  In general this is a frail elderly female in no acute distress but does appear to be uncomfortable.  Her skin is warm and dry she is not overtly diaphoretic.  Oropharynx appears to be clear mucous membranes fairly moist.  Chest there  is poor respiratory effort but I could not appreciate any congestion or significantly labored breathing.  Her heart is irregular irregular rate and rhythm without murmur gallop or rub she has what I would say baseline 1+ lower extremity edema bilaterally.--could not really appreciate any increased pain when I pressed on the area she's pointing at --did not note any deformity here  Her abdomen is soft does not appear to be acutely tender with positive bowel sounds.  Muscle skeletal she does ambulate in a wheelchair appears to be moving all her extremities at baseline I did not note any deformities.  Neurologic is grossly intact she does have severe dementia but I could not really appreciate any lateralizing findings her speech appears to be clear cranial nerves appear to be grossly intact.  Psych she is again does have severe dementia which is her baseline is some yellowing which is not new but complaining of the chest pain he has.   Labs.  12/14/2013.  WBC 5.7 hemoglobin 11.6 platelets 246.  12/13/2013. TSH2.25.  12/14/2013.  Sodium 137 potassium 5 BUN 26 creatinine 0.9.  Albumin 3.4 otherwise liver function tests within normal limits.  Assessment and plan.  #1-chest pain-apparently of acute onset per nursing-patient has an atypical presentation holding and gripping the left side df her chest complaining of chest pain and this did not appear to change during my exdm-patient's severe dementia makes obtaining a history of this  From patient quite difficult-we will send her to the ER for emergent evaluation  rule out cardiac acute event.  She also had hypoxia although did not appear clinically to be acutely short of breath-I did monitor her serially before EMS arrived her condition appear to be unchanged vital signs continued be stable again O2 sats ration did go up on oxygen-clinically she remained stable although  uncomfortable.  [id number]

## 2014-02-17 ENCOUNTER — Encounter: Payer: Self-pay | Admitting: Internal Medicine

## 2014-02-19 ENCOUNTER — Encounter: Payer: Self-pay | Admitting: Internal Medicine

## 2014-02-19 ENCOUNTER — Non-Acute Institutional Stay (SKILLED_NURSING_FACILITY): Payer: Medicare Other | Admitting: Internal Medicine

## 2014-02-19 DIAGNOSIS — M150 Primary generalized (osteo)arthritis: Secondary | ICD-10-CM

## 2014-02-19 DIAGNOSIS — I482 Chronic atrial fibrillation, unspecified: Secondary | ICD-10-CM

## 2014-02-19 DIAGNOSIS — F0391 Unspecified dementia with behavioral disturbance: Secondary | ICD-10-CM

## 2014-02-19 DIAGNOSIS — N289 Disorder of kidney and ureter, unspecified: Secondary | ICD-10-CM

## 2014-02-19 DIAGNOSIS — I10 Essential (primary) hypertension: Secondary | ICD-10-CM

## 2014-02-19 DIAGNOSIS — R609 Edema, unspecified: Secondary | ICD-10-CM

## 2014-02-19 DIAGNOSIS — M159 Polyosteoarthritis, unspecified: Secondary | ICD-10-CM

## 2014-02-19 DIAGNOSIS — F03918 Unspecified dementia, unspecified severity, with other behavioral disturbance: Secondary | ICD-10-CM

## 2014-02-19 NOTE — Progress Notes (Signed)
Patient ID: Alexis Archer, female   DOB: 07/20/1914, 80 y.o.   MRN: [id number]   This is a routine visit.  Level care skilled.  Facility [facility].  Chief complaint-medical management of dementia hypertension glaucoma anemia osteoarthritis atrial fibrillation-acute visit status post ER visit for chest pain.  History of present illness.  Patient is a 80 year old female who has been a long-term resident of this facility.  She is been relatively stable however last week she developed what appeared to be a fairly sudden complaint of left-sided chest pain gripping or chest saying the pain was constant.  She was sent to the ER where workup was apparently negative for cardiac etiology where it appears her cardiac enzymes were negative with a troponin of 0.02.  Chest x-ray did not show any acute process either.  Her labs are relatively unremarkable she is come back to the facility and actually is no longer complaining of any chest pain appears to be at her baseline today-appears to be comfortable.  She does have a history of significant dementia she is followed by psychiatric services and they have adjusted her Depakote.  She also has a history of osteoarthritis controlled currently with Tylenol.  She is on topical eyedrops for her glaucoma.  In for osteoporosis continues on calcium.  She has a listed history of hypertension not on any medication and blood pressures appear to be stable most recently.132/89  She also was recently diagnosed with atrial fibrillation she is on low dose aspirin she does have chronic bruising so this is been a balancing act with her atrial fibrillation anticoagulation but does not appear to be increased from her baseline per exam today.  Family medical social history is been reviewed per previous progress notes including 01/28/2014.  Medications have been reviewed per MAR.  Review of systems.  Unobtainable secondary to dementia please see history of  present illness coronary nursing staff she has return largely to her baseline.  Physical exam.  Temperature 98.1 pulse 82 respirations 18 blood pressure 132/89  In general this is a frail elderly female in no distress sitting comfortably in her wheelchair she denies any pain today.  Her skin is warm and dry she does have some chronic bruising of all her extremities this does not appear to be new she does have dry dressing applied over an area on her lower right arm.  Eyes she has prescription lenses pupils appear to be reactive visual acuity grossly intact.  Her chest is clear there is no labored breathing.  Heart is regular irregular rate and rhythm without murmur gallop or rub she appears to have her baseline lower extremity edema I would say trace Areato 1+. Her abdomen soft nontender with positive bowel sounds.Muscle skeletal moves all extremities 4 strength appears to be intact she does not really follow verbal commands-I do not note any deformities.  Neurologic is grossly intact she is agitated with attempts at invasive examination-cranial nerves appear to be grossly intact again strength appears to be intact I do not see any lateralizing findings.  Psych she does have severe dementia she is alert does not really follow verbal commands however.  Labs.  Most recent labs obtained in the ER on November 12 showed a white count of 10.7 hemoglobin 11.0 platelets 381 neutrophils were somewhat high at 8.8.  Sodium was 139 potassium 4.5 BUN 20 creatinine 0.84 liver function tests appear to be unremarkable except for a albumin of 2.8.  Assessment and plan.  #1-history of chest pain-this  appears to have resolved unremarkably she is at her baseline today hospital workup was negative there's been no recurrence which is reassuring . #2history of atrial fibrillation continues on aspirin not on any rate control agent nonetheless her rate appears to be controlled continue to monitor this appears  stable  . Dementia with behaviorsshe is followed by psychiatric services in the facility she is on Depakote apparently this has helped some.  #4-anemia of chronic disease this appears to be relatively stable hemoglobin at 11 appears recent hemoglobins have ranged between 11-12  #5-history of lower extremity edema this appears baseline she had been on Lasix at one point this has been discontinued nonetheless or edema appears to be stable.  #6-history of glaucoma -- continues on topical eyedrops.  #7-hypertension listed history but this appears stable on no medications.  #8-apparently some history renal insufficiency at this point appears stable per most recent lab.  #9-minimal leukocytosis on hospital lab-this may be reactive will update this Day to ensure stability she does not exhibit signs of infection.  Osteoporosis she does continue on calcium.  #11-history of osteoarthritis this appears to be controlled currently on Tylenol.   [id number]  [id number]

## 2014-02-19 NOTE — Progress Notes (Signed)
Patient ID: Ferman HammingDorothy Pennings, female   DOB: 1915/01/21, 80 y.o.   MRN: [id number]  Diagnoses     Edema - Primary    ICD-9-CM: 782.3 ICD-10-CM: R60.9    Bruises easily     ICD-9-CM: 782.9 ICD-10-CM: R23.8    Chronic atrial fibrillation     ICD-9-CM: 427.31 ICD-10-CM: I48.2    Essential hypertension, benign     ICD-9-CM: 401.1 ICD-10-CM: I10    Dementia with behavioral disturbance     ICD-9-CM: 294.21 ICD-10-CM: F03.91    Renal insufficiency     ICD-9-CM: 593.9 ICD-10-CM: N28.9       Reason for Visit     Medical Management of Chronic Issues    Acute Visit    Reason for Visit History        Current Vitals  Most recent update: 01/28/2014 2:53 PM by Roena MaladyArlo C Dustine Bertini, PA-C    BP Pulse Temp(Src) Resp        112/78 mmHg 77 97 F (36.1 C) 18        Progress Notes      Roena MaladyArlo C Samon Dishner, PA-C at 01/28/2014 2:55 PM     Status: Signed       Expand All Collapse All   Patient ID: Ferman Hammingorothy Toor, female DOB: 1915/01/21, 80 y.o. MRN: [id number]   This is an acute visit.  Level of care skilled.  Facility is [facility].                                                  Not recorded      .  Chief complaint -medical management of dementia hypertension glaucoma anemia osteoarthritsi -a fib.--acute visit secondary to left hand edema  History of patient is a 80 year old female with the above diagnoses according to nursing staff she is stable-she does have instances of behaviors yelling apparently spitting-she is seen by psychiatric services and they recently increased her Depakote to 250 mg twice a day ---  .  she does have a history of osteoarthritis this appears to be controlled currently on Tylenol she also has an order for Voltaren gel as needed.  In regards glucoma she continues on topical eyedrops.  And osteoporosis she is on calcium supplementation.  She  has a listed history of hypertension but is not on any medication --appears stable recent BP-112/78 .  Recently on a routine visit-- she was noted to have some irregular rhythm-EKG showed suspected atril fib she has been started on aspirinEC 81 mg and this appears to be rate controlled   S she continues to have some chronic bruising  Nursing staff has noted some edema of her left hand there apparently is been no recent history of trauma she does not really complain of pain with this    Family medical social history as been reviewed her previous progress notes most recently 10/01/2013--  .  Medications have been reviewed per MAR  .  Review of systems obtained from nursing since patient cannot really give review of systems secondary to dementia.  In general no fever chills noted Skin-as noted appears to have some bruising especially lower extremity.  Respiratory no complaints shortness breath or cough according to nursing.  Cardiac-no complaints of chest pain she has some mild lower extremity and some edema of her left hand.  GI-no recent nausea vomiting or complaints of  abdominal pain noted apparently no constipation.  Muscle skeletal does complain of osteoarthritic pain especially of her knees apparently this is controlled.  Neurologic no complaints of dizziness headache.  Psych she does have significant dementia with behaviors is seen by psychiatric services--apparently recently this has been more stable although apparently she does have behaviors at times  .  Physical exam. temperature 97.0 pulse 77 respirations 18 blood pressure 112/78-122/66-143/97- In general this is a somewhat frail elderly female in no distress .  Her skin is warm and dry --she does have some chronic bruising--I do note some violaceous changes to her left arm proximal hand area this is edematous cool to touch does not appear to be tender    .  Chest is clear to auscultation no labored  breathing.  Heart is regular--irregular rate and rhythm without murmur gallop or rub--she has what appears to be baseline lower extremity edema-.  Abdomen soft nontender positive bowel sounds.  Muscle skeletal is able to move all extremities x4 strength appears appropriate in all extremities although she did not really follow verbal instruction. she has a edema of her left hand as noted above radial pulse is intact--grip strength appears to be fairly strong although somewhat difficult to fully tell since patient does have dementia She does have 1+ lower extremity edema apparently this is chronic.  Neurologic-limited exam the cranial nerves are intact her speech is clear appears able to move all extremities without lateralizing findings.  Psych she is alert and oriented to person but does not really follow verbal commands  .  Labs   12/14/2013. WBC 5.7 hemoglobin 11.        October 11 2013.  WBC 6.5 hemoglobin 11.7 platelets 239.  10/03/2013.  WBC 6.6 hemoglobin 11.9 platelets 235.  Rhinitis 2015.  Sodium 140 potassium 4.5 BUN 28 crit oh 0.9.  10/03/2013.  Liver function tests within normal limits except albumin of 3.1.  Depakote level-31.9.   09/24/2013.  Sodium 139 potassium 4.8 BUN 30 creatinine 1.4.  6-09- 2015.  Sodium 138 potassium 5.1 BUN 23 creatinine 0.8.  06/first 2015.  TSH is 2.244  08/02/2013.  WBC 5.7 hemoglobin 12.7 platelets 267.  Sodium 138 potassium 4.5 BUN 29 creatinine 1.0.  .  Jan 22nd 2015  .  Sodium 139 potassium 4.7 BUN 30 creatinine 1.0.  WBC 6.0 hemoglobin 13.0 platelets 332.  Albumin 3.3 otherwise liver function tests within normal limits  .  Assessment and plan  .  #1-osteoarthritis-at this point appears to be controlled on current medications including Tylenol  #2 osteoporosis she is on calcium.  #3 dementia with behaviors she is seen by psychiatric services has been started on Depakote apparently with  some beneficial effect. --Will update Depakote level and liver function tests  #4-anemia of chronic disease this has been stable in the past Will update  #5 lower extremity edema apparently this is at baseline. At one point she had been on low dose Lasix she's no longer on this continue to monitor for any changes clinically appears stable.  #6 depression--she was on Zoloft but this was discontinued by psychiatric services-again they are following this   #7-A. fib-this appears to be rate controlled she is on aspirin for anticoagulation--  I do note the bruising she has some history of this-I did previously discuss this with Dr. Lyn HollingsheadAlexander and at this point we'll continue the aspirin secondary to her atrial fibrillation again this is a challenging balancing situation--with history of bruising versus anticouagulation need with  hx of afib   .  Glaucoma she continues on topical eyedrops including Latanoprost and combigan  9  Hypertension-as noted above this appears stable despite not being on any medication   10-renal insufficiency- At this point appears stable with most recent creatinine 0.9 BUN of 28 which appears to be relatively baseline-Will update  #11-left hand edema-this does not appear to be painful capillary refill is intact radial pulse intact-will order x-ray also a venous Doppler to rule out any clot etiology  [id number]                  Therapy Notes     No notes of this type exist for this encounter.     Not recorded        Other Encounter Related Information     Allergies & Medications    Problem List    History    Patient-Entered Questionnaires      Level of Service     PR SBSQ NURSING FACIL CARE/DAY NEW PROBLEM 25 MIN [99309]         All Flowsheet Templates (all recorded)     Encounter Vitals    Nursing Home Patient Info      All Charges for This Encounter     Code Description Service Date Service  Provider Modifiers Qty    [phone] PR SBSQ NURSING FACIL CARE/DAY NEW PROBLEM 25 MIN 01/28/2014 Roena Malady, PA-C  1      Routing History     There are no sent or routed communications associated with this encounter.     AVS Reports     No AVS Snapshots are available for this encounter.     Smoking Cessation Audit Trail       Diabetic Foot Exam    No data filed     Diabetic Foot Form - Detailed    No data filed     Diabetic Foot Exam - Simple    No data filed     Guarantor Account: [place], [place] ([id number])     Relation to Patient: Account Type Service Area    Self Personal/Family [place] SERVICE AREA       Coverages for This Account     Coverage ID Payor Plan Insurance ID    [phone] [id number] MEDICARE MEDICARE PART A AND B [id number] A    [id number] [facility] [id number]        Guarantor Account: [place], [place] ([id number])     Relation to Patient: Account Type Service Area    Self Personal/Family [place] [facility]       Coverages for This Account     Coverage ID Payor Plan Insurance ID    [id number] MEDICARE MEDICARE PART A AND B [id number] A    [id number] [facility] [id number]        Guarantor Account: Lidya, [place] ([id number])     Relation to Patient: Account Type Service Area    Self Personal/Family [facility]       This encounter was created in error - please disregard.

## 2014-03-04 ENCOUNTER — Non-Acute Institutional Stay (SKILLED_NURSING_FACILITY): Payer: Medicare Other | Admitting: Internal Medicine

## 2014-03-04 DIAGNOSIS — F03918 Unspecified dementia, unspecified severity, with other behavioral disturbance: Secondary | ICD-10-CM

## 2014-03-04 DIAGNOSIS — F0391 Unspecified dementia with behavioral disturbance: Secondary | ICD-10-CM

## 2014-03-04 DIAGNOSIS — R5383 Other fatigue: Secondary | ICD-10-CM

## 2014-03-04 DIAGNOSIS — I482 Chronic atrial fibrillation, unspecified: Secondary | ICD-10-CM

## 2014-03-06 ENCOUNTER — Encounter: Payer: Self-pay | Admitting: Internal Medicine

## 2014-03-06 NOTE — Progress Notes (Signed)
Patient ID: Alexis Archer, female   DOB: 1914-04-28, 80 y.o.   MRN: [id number] Patient ID: Alexis Archer, female   DOB: 1914-04-28, 80 y.o.   MRN: [id number]  t  Patient ID: Alexis Archer, female   DOB: 1914-04-28, 80 y.o.   MRN: [id number]  .    Patient ID: Alexis Archer, female   DOB: 1914-04-28, 80 y.o.   MRN: [id number]   This is an acute visit.  Level care skilled.  Facility [facility].  Chief complaint- Acute visit secondary to question change in status--lethargy.  History of present illness.  Patient is a 80 year old female who has been a long-term resident of this facility.  She has been relatively stable however several weeks ago she developed what appeared to be a fairly sudden complaint of left-sided chest pain gripping or chest saying the pain was constant.  She was sent to the ER where workup was apparently negative for cardiac etiology where it appears her cardiac enzymes were negative with a troponin of 0.02.  Chest x-ray did not show any acute process either.  Her labs were fairly unremarkable-she returned to the facility and apparently has been relatively chest pain free to my knowledge  H .  She does have a history of significant dementia she is followed by psychiatric services and they have adjusted her Depakote.  She also has a history of osteoarthritis controlled currently with Tylenol.  She is on topical eyedrops for her glaucoma.  In for osteoporosis continues on calcium.  She has a listed history of hypertension not on any medication and blood pressures appear to be stable most recently. 104/69-114/60  She also was recently diagnosed with atrial fibrillation she is on low dose aspirin she does have chronic bruising so this is been a balancing act with her atrial fibrillation anticoagulation but does not appear to be increased from her baseline per exam today   Her daughter tells me today she saw her mom was somewhat more lethargic recently not  quite herself-however when I later evaluated her-her daughter said she appeared to be more at her baseline today quite confused but quite animated--which is what I am seeing today.  According nursing staff she does have days where she somewhat more lethargic and other days where she is somewhat more active.  Her vital signs again continue to be stable.  Family medical social history is been reviewed per previous progress notes including 01/28/2014.  Medications have been reviewed per MAR.  Review of systems.  Unobtainable secondary to dementia please see history of present illness  Physical exam.  Temperature 96.7 pulse 85 respirations 20 blood pressure 104/69  In general this is a frail elderly female in no distress sitting comfortably in her wheelchair she is somewhat agitated with exam-quite lively which is her baseline when I have seen her in the past.  Her skin is warm and dry she does have some chronic bruising of all her extremities this does not appear to be new she does have dry dressing applied over some areas.  Eyes she has prescription lenses pupils appear to be reactive visual acuity grossly intact.  Her chest is clear there is no labored breathing.  Heart is regular irregular rate and rhythm without murmur gallop or rub she appears to have her baseline lower extremity edema I would say trace -- 1+ . Her abdomen soft nontender with positive bowel sounds  .Muscle skeletal moves all extremities 4 strength appears to be intact she does not really follow verbal  commands-I do not note any deformities.  Neurologic is grossly intact she is agitated with attempts at invasive examination-cranial nerves appear to be grossly intact again strength appears to be intact I do not see any lateralizing findings--this is baseline for Andilynn.  Psych she does have severe dementia she is alert does not really follow verbal commands however.  Labs. 01/29/2014.  WBC 8.1 hemoglobin 10.5  platelets 350.  Sodium 144 potassium 4.2 BUN 31 creatinine 0.8.  Valproic acid level X.4-.  During recent ER visit...   liver function tests appear to be unremarkable except for a albumin of 2.8.  Assessment and plan.  #1-change in mental status lethargy-per exam today as well as talking with her daughter and nursing staff she appears to be at her baseline today quite animated but confused-will update her Depakote level also obtain a CBC a metabolic panel-also will obtain a urine urinalysis and culture--.  Her presentation today however is reassuring  #2-history of chest pain-this appears to have resolved unremarkably she is at her baseline today hospital workup was negative there's been no recurrence which is reassuring . #3history of atrial fibrillation continues on aspirin not on any rate control agent nonetheless her rate appears to be controlled continue to monitor this appears stable  . #4- Dementia with behaviorsshe is followed by psychiatric services in the facility she is on Depakote apparently this has helped some.  #5-anemia of chronic disease this appears to be relatively stable hemoglobin at 10.5 appears recent hemoglobins have ranged between 11-12--we will be updating this  #6-history of lower extremity edema this appears baseline she had been on Lasix at one point this has been discontinued nonetheless or edema appears to be stable.  #7-history of glaucoma -- continues on topical eyedrops.  #7-hypertension listed history but this appears stable on no medications--recent blood pressures 104/69-114/60.  #8-apparently some history renal insufficiency at this point appears stable per most recent lab--again will be updating this.  .   .  #10-history of osteoarthritis this appears to be controlled currently on Tylenol.--She does not appear uncomfortable today   [id number]

## 2014-03-18 ENCOUNTER — Non-Acute Institutional Stay (SKILLED_NURSING_FACILITY): Payer: Medicare Other | Admitting: Internal Medicine

## 2014-03-18 DIAGNOSIS — I482 Chronic atrial fibrillation, unspecified: Secondary | ICD-10-CM

## 2014-03-18 DIAGNOSIS — R05 Cough: Secondary | ICD-10-CM

## 2014-03-18 DIAGNOSIS — F0391 Unspecified dementia with behavioral disturbance: Secondary | ICD-10-CM

## 2014-03-18 DIAGNOSIS — I10 Essential (primary) hypertension: Secondary | ICD-10-CM

## 2014-03-18 DIAGNOSIS — R059 Cough, unspecified: Secondary | ICD-10-CM

## 2014-03-18 DIAGNOSIS — R609 Edema, unspecified: Secondary | ICD-10-CM

## 2014-03-18 DIAGNOSIS — F03918 Unspecified dementia, unspecified severity, with other behavioral disturbance: Secondary | ICD-10-CM

## 2014-03-18 DIAGNOSIS — N289 Disorder of kidney and ureter, unspecified: Secondary | ICD-10-CM

## 2014-03-21 ENCOUNTER — Non-Acute Institutional Stay (SKILLED_NURSING_FACILITY): Payer: Medicare Other | Admitting: Internal Medicine

## 2014-03-21 ENCOUNTER — Encounter: Payer: Self-pay | Admitting: Internal Medicine

## 2014-03-21 DIAGNOSIS — I82402 Acute embolism and thrombosis of unspecified deep veins of left lower extremity: Secondary | ICD-10-CM

## 2014-03-21 DIAGNOSIS — I482 Chronic atrial fibrillation, unspecified: Secondary | ICD-10-CM

## 2014-03-21 DIAGNOSIS — I82409 Acute embolism and thrombosis of unspecified deep veins of unspecified lower extremity: Secondary | ICD-10-CM | POA: Insufficient documentation

## 2014-03-21 NOTE — Progress Notes (Signed)
Patient ID: Alexis Archer, female   DOB: 1914/08/14, 80 y.o.   MRN: [id number]   This is an acute visit.  Level care skilled.  Facility [facility].  Chief complaint-acute visit secondary to DVT left leg.  History of present illness.  Patient is a 80 year old female who was recently seen for increased edema-there was concern about increase left leg edema and a venous Doppler was ordered which came back positive for a DVT of the distal segment of the left superficial femoral vein.  Patient does have a history of atrial fibrillation is on aspirin chronically.  Initially patient was started on Lovenox injections twice a day-and bed rest.  She has been stable she does have severe dementia and is a poor historian but appears to be at her baseline per nursing vital signs have been stable.  She does not complaining acutely of left leg discomfort.  Family medical social history is been reviewed including previous progress note 02/19/2014.  Medications have been reviewed per MAR.  Review of systems.  Very limited secondary to dementia.  As noted in history of present illness she does not appear to have significant pain chest pain shortness of breath or leg discomfort.  Physical exam.  Touch is 97.0 pulse 56 respirations 18 blood pressure 139/62.  In general this is a somewhat frail elderly female in no distress lying comfortably in bed she is somewhat agitated with exam.  Her skin is warm and dry she does have somewhat chronic bruising and hematomas but this does not appear to be increased from her baseline she does have a small amount of bleeding from skin tear on her right lower leg-this is currently covered with dry dressing.  She has some chronic bruising of her hands bilaterally with some covering of a skin tear on her right hand-as well as chronic skin changes on her lower legs bilaterally.  Sclera and conjunctiva are clear visual acuity appears to be intact.  Oropharynx clear  mucous membranes moist.  Chest is clear to auscultation with poor respiratory effort.  Heart is irregular irregular rate and rhythm without murmur gallop or rub-she does appear to have again somewhat increased edema on her left leg versus the right -- does not appear to be significantly tender with a reduced pedal pulse.  Her abdomen is soft nontender with positive bowel sounds.  Muscle skeletal-is able to move all her extremities 4 strength appears to be intact.  Neurologic appears grossly intact cranial nerves intact I do not see any lateralizing findings.  Psych continues with severe dementia is agitated with exam which is her baseline.  Labs.  03/05/2014.  Sodium 138 potassium 4.2 BUN 18 creatinine 0.7-albumin 2.6 otherwise liver function tests within normal limits.  WBC 7.4 hemoglobin 10.7 platelets 369.  Assessment and plan.  #1-left leg DVT-new problem-this is  complicated with patient's history of frequent bruising and I suspect some fall risk with her severe dementia-this was discussed with Dr. Lyn HollingsheadAlexander via phone-at this point we will discontinue the Lovenox and start Xarelto-per recommendation 15 mg  by mouth twice a day for 21 days and then 20 mg daily thereafter-.  Also will update lab work including a CBC and metabolic panel to ensure stability-clinically she appears to be stable although certainly her history of significant dementia complicates matters.  We will discontinue the aspirin-and bed rest.  Monitor vital signs with pulse ox every shift 72 hours  .  This was discussed with her daughter-her responsible party-via phone-we did discuss the  bleeding risk-and responsible party is aware of this---and is in agreement with the plan of care.  [id number]-of note greater than 40 minutes spent assessing patient-discussing her status with nursing staff-as well as with her responsible party-and coordinating and formulating a plan of care-of note greater than 50% of time  spent coordinating plan of care

## 2014-03-24 ENCOUNTER — Encounter: Payer: Self-pay | Admitting: Internal Medicine

## 2014-03-24 NOTE — Progress Notes (Signed)
Patient ID: Alexis Archer, female   DOB: February 11, 1915, 80 y.o.   MRN: [id number]   This is a routine/ acute  visit.  Level care skilled.  Facility [facility].  Chief complaint-medical management of dementia hypertension glaucoma anemia osteoarthritis atrial fibrillation-acute visit secondary to cough.and edema  History of present illness.  Patient is a 80 year old female who has been a long-term resident of this facility--according to nursing staff she has developed a cough with some chest congestion-she has been afebrile O2 sats of been in the 90s on room air  Nursing staff is also noted some increased  Lower extremity edema . Otherwise She has been relatively stable --seen recently for lethargy which appears to have resolved her labs were fairly unremarkable.  Also  Last month   had been sent to the ER for chest pain with no acute cardiology etiology found .    Chest x-ray did not show any acute process either.   .  She does have a history of significant dementia she is followed by psychiatric services and they have adjusted her Depakote.  She also has a history of osteoarthritis controlled currently with Tylenol.  She is on topical eyedrops for her glaucoma.   for osteoporosis continues on calcium.  She has a listed history of hypertension not on any medication and blood pressures appear to be stable most recently.132/89  She also was recently diagnosed with atrial fibrillation she is on low dose aspirin she does have chronic bruising so this is been a balancing act with her atrial fibrillation anticoagulation but does not appear to be increased from her baseline per exam today.  Family medical social history is been reviewed per previous progress notes including 01/28/2014.  Medications have been reviewed per MAR.  Review of systems.  Unobtainable secondary to dementia please see history of present illness  Physical exam.  Temperature 97.8 pulse 53 respirations 18  blood pressure 103/61-140/80-appears to be in this range  In general this is a frail elderly female in no distress sitting comfortably in her wheelchair she denies any pain today.  Her skin is warm and dry she does have some chronic bruising of all her extremities this does not appear to be new she does have dry dressing applied over an area on her lower right arm.  Eyes she has prescription lenses pupils appear to be reactive visual acuity grossly intact.  Her chest is clear -there is no labored breathing but has quite shallow air entry  Heart is regular irregular rate and rhythm without murmur gallop or rub she appears to have creased lower extremity edema-this is more her left leg I would say 2+ edema- edema on the right  1+ -this is cool to touch does not appear to be significantly erythematous more than baseline-there is some tenderness although this may be more reaction to the invasive maneuver--pedal pulses are somewhat difficult to assess secondary to the edema  Her abdomen soft nontender with positive bowel  .Muscle skeletal moves all extremities 4 strength appears to be intact she does not really follow verbal commands-I do not note any deformities.  Neurologic is grossly intact she is agitated with attempts at invasive examination-cranial nerves appear to be grossly intact again strength appears to be intact I do not see any lateralizing findings.  Psych she does have severe dementia she is alert does not really follow verbal commands however.  Labs.  03/05/2014.  TSH-2.31.  WBC 7.4 hemoglobin 10.7 platelets 369.  Sodium 138 potassium 4.2  BUN 18 creatinine 0.7.  Albumin 2.3.  Otherwise liver function tests within normal limits.  Valproic acid level-17.8   labs obtained in the ER on November 12 showed a white count of 10.7 hemoglobin 11.0 platelets 381 neutrophils were somewhat high at 8.8.  Sodium was 139 potassium 4.5 BUN 20 creatinine 0.84 liver function tests appear  to be unremarkable except for a albumin of 2.8.  Assessment and plan.  #1-history of cough and chest congestion-will start Mucinex 600 mg twice a day for 5 days-also obtain a chest x-ray-monitor vital signs pulse ox every shift for 48 hours.    . #2history of atrial fibrillation continues on aspirin not on any rate control agent nonetheless her rate appears to be controlled continue to monitor this appears stable  . Dementia with behaviorsshe is followed by psychiatric services in the facility she is on Depakote apparently this has helped some.  #4-anemia of chronic disease this appears to be relatively stable hemoglobin at 10.7 appears recent hemoglobins have ranged between 11-12  #5-history of lower extremity edema this appears increased mostly  on the left-will order a venous Doppler-also monitor weights 2 times a week notify provider of gain greater than 3 pounds.  Also will give Lasix 20 mg a day for 3 days as well as potassium supplementation for 3 days then DC-obtain a BMP in 2 days-at one point she had been on Lasix routinely .  #6-history of glaucoma -- continues on topical eyedrops.  #7-hypertension listed history but this appears stable on no medications.  #8-apparently some history renal insufficiency at this point appears stable per most recent lab--again will update this later this week.   #9 .  Osteoporosis she does continue on calcium.  #10-history of osteoarthritis this appears to be controlled currently on Tylenol.   [id number]-of note greater than 35 minutes assessing patient-discussing her status with nursing staff-and coordinating and formulating a plan of care for numerous diagnoses-of note greater than 50% of time spent coordinating plan of care

## 2014-03-31 ENCOUNTER — Non-Acute Institutional Stay (SKILLED_NURSING_FACILITY): Payer: Medicare Other | Admitting: Internal Medicine

## 2014-03-31 ENCOUNTER — Encounter: Payer: Self-pay | Admitting: Internal Medicine

## 2014-03-31 DIAGNOSIS — I82402 Acute embolism and thrombosis of unspecified deep veins of left lower extremity: Secondary | ICD-10-CM

## 2014-03-31 DIAGNOSIS — R05 Cough: Secondary | ICD-10-CM

## 2014-03-31 DIAGNOSIS — R059 Cough, unspecified: Secondary | ICD-10-CM

## 2014-03-31 NOTE — Assessment & Plan Note (Addendum)
I evaluated pt for cough, possible PNA because daughter had requested. Pt's room is across from nursing station where i sat for at least 30 min and I did not hear pt cough even once. Nursing has stated that they have not heard her cough either. Exam was not consistent with a respiratory illness, heart failure or other pulmonary process. Will continue observation.

## 2014-03-31 NOTE — Assessment & Plan Note (Signed)
Pt was dx last week. She was started on lovenox then switched to Xarelto BID for 21 days then daily. There was some question by family that she has been on bedrest since dx but orders reveal pt was on bedrest only for 1 day then it was d/c.

## 2014-03-31 NOTE — Progress Notes (Signed)
MRN: [id number] Name: Alexis Archer  Sex: female Age: 80 y.o. DOB: 22-Jun-1914  [facility] Facility/Room: 407 Level Of Care: SNF Provider: Merrilee SeashoreALEXANDER, Amry Cathy D Emergency Contacts: Extended Emergency Contact Information Primary Emergency Contact: Roundtree,Pat(Poa) Address: [address]          [place], [place] [address] [place] Home Phone: [phone] Work Phone: [phone] Mobile Phone: [phone] Relation: Daughter  Code Status:DNR   Allergies: Review of patient's allergies indicates no known allergies.  Chief Complaint  Patient presents with  . Acute Visit    HPI: Patient is 80 y.o. female who is being seen for concerns of a cough and for why pt is still on bedrest per pt's daughter.  Past Medical History  Diagnosis Date  . Hypertension   . Glaucoma   . GERD (gastroesophageal reflux disease)   . Anemia   . Osteoarthritis     No past surgical history on file.    Medication List       This list is accurate as of: 03/31/14  3:45 PM.  Always use your most recent med list.               acetaminophen 500 MG tablet  Commonly known as:  TYLENOL  Take 500 mg by mouth 3 (three) times daily.     aspirin EC 81 MG tablet  Take 81 mg by mouth daily.     calcitonin (salmon) 200 UNIT/ACT nasal spray  Commonly known as:  MIACALCIN/FORTICAL  Place 1 spray into the nose daily. Alternates nostrils every day     calcium-vitamin D 500-200 MG-UNIT per tablet  Commonly known as:  OSCAL WITH D  Take 1 tablet by mouth 3 (three) times daily.     COMBIGAN 0.2-0.5 % ophthalmic solution  Generic drug:  brimonidine-timolol  Place 1 drop into both eyes every 12 (twelve) hours.     diclofenac sodium 1 % Gel  Commonly known as:  VOLTAREN  Apply 2 g topically 4 (four) times daily. Apply to  knees 4 times a day as needed for pain     divalproex 125 MG DR tablet  Commonly known as:  DEPAKOTE  Take 250 mg by mouth 3 (three) times daily.     latanoprost 0.005 % ophthalmic solution  Commonly known as:  XALATAN  Place 1 drop into both eyes at bedtime.     LORazepam 1 MG tablet  Commonly known as:  ATIVAN  1 tablet by mouth prior to dental procedure on 01/04/13 as directed by staff        No orders of the defined types were placed in this encounter.    Immunization History  Administered Date(s) Administered  . Influenza Whole 01/14/2013  . Influenza-Unspecified 01/15/2014    History  Substance Use Topics  . Smoking status: Never Smoker   . Smokeless tobacco: Not on file  . Alcohol Use: No    Review of Systems  DATA OBTAINED: from patient, nurse GENERAL:  no fevers, fatigue, appetite changes SKIN: No itching, rash HEENT: No complaint RESPIRATORY: No cough, wheezing, SOB CARDIAC: No chest pain, palpitations, lower extremity edema  GI: No abdominal pain, No N/V/D or constipation, No heartburn or reflux  GU: No dysuria, frequency or urgency, or incontinence  MUSCULOSKELETAL: No unrelieved bone/joint pain NEUROLOGIC: No headache, dizziness  PSYCHIATRIC: No overt anxiety or sadness  Filed Vitals:   03/31/14 1517  BP: 139/62  Pulse: 94  Temp: 97.8 F (36.6 C)  Resp:  20    Physical Exam  GENERAL APPEARANCE: Alert, min conversant, No acute distress  SKIN: No diaphoresis rash HEENT: Unremarkable RESPIRATORY: Breathing is even, unlabored. Lung sounds are diffusely decreased without wheezes or rales   CARDIOVASCULAR: Heart RRR no murmurs, rubs or gallops. No peripheral edema  GASTROINTESTINAL: Abdomen is soft, non-tender, not distended w/ normal bowel sounds.  GENITOURINARY: Bladder non tender, not distended  MUSCULOSKELETAL: No abnormal joints or musculature NEUROLOGIC: Cranial nerves 2-12 grossly intact. Moves all extremities PSYCHIATRIC: dementia, no behavioral issues  Patient Active Problem List   Diagnosis Date Noted  . DVT (deep venous thrombosis) 03/21/2014  . Tremor 12/11/2013  . Speech  abnormality 12/11/2013  . Bruises easily 11/29/2013  . Renal insufficiency 10/01/2013  . Dementia with behavioral disturbance 10/01/2013  . Atrial fibrillation 08/30/2013  . Cellulitis 07/30/2013  . Fever, unspecified 05/23/2013  . Cough 05/23/2013  . Edema 10/19/2012  . Depression 10/19/2012  . Glaucoma 10/19/2012  . Essential hypertension, benign 08/11/2012  . Osteoarthritis 08/11/2012  . Osteoporosis, unspecified 08/11/2012  . Hypopotassemia 08/11/2012    CBC    Component Value Date/Time   WBC 16.3* 03/26/2011 2338   RBC 3.95 03/26/2011 2338   HGB 11.7* 03/26/2011 2338   HCT 35.2* 03/26/2011 2338   PLT 244 03/26/2011 2338   MCV 89.1 03/26/2011 2338   LYMPHSABS 0.8 11/26/2009 1724   MONOABS 1.0 11/26/2009 1724   EOSABS 0.3 11/26/2009 1724   BASOSABS 0.0 11/26/2009 1724    CMP     Component Value Date/Time   NA 137 03/26/2011 2338   K 4.9 03/26/2011 2338   CL 104 03/26/2011 2338   CO2 22 03/26/2011 2338   GLUCOSE 98 03/26/2011 2338   BUN 61* 03/26/2011 2338   CREATININE 1.29* 03/26/2011 2338   CALCIUM 9.0 03/26/2011 2338   PROT 6.0 11/26/2009 1724   ALBUMIN 3.2* 11/26/2009 1724   AST 19 11/26/2009 1724   ALT 14 11/26/2009 1724   ALKPHOS 75 11/26/2009 1724   BILITOT 0.9 11/26/2009 1724   GFRNONAA 34* 03/26/2011 2338   GFRAA 40* 03/26/2011 2338    Assessment and Plan  DVT (deep venous thrombosis) Pt was dx last week. She was started on lovenox then switched to Xarelto BID for 21 days then daily. There was some question by family that she has been on bedrest since dx but orders reveal pt was on bedrest only for 1 day then it was d/c.  Cough I evaluated pt for cough, possible PNA because daughter had requested. Pt's room is across from nursing station where i sat for at least 30 min and I did not hear pt cough even once. Nursing has stated that they have not heard her cough either. Exam was not consistent with a respiratory illness or heart failure. Will  continue observation.   Pt seen 03/26/2014 Margit HanksALEXANDER, Yadira Hada D, MD

## 2014-04-18 ENCOUNTER — Non-Acute Institutional Stay (SKILLED_NURSING_FACILITY): Payer: Medicare Other | Admitting: Internal Medicine

## 2014-04-18 ENCOUNTER — Encounter: Payer: Self-pay | Admitting: Internal Medicine

## 2014-04-18 DIAGNOSIS — F0391 Unspecified dementia with behavioral disturbance: Secondary | ICD-10-CM

## 2014-04-18 DIAGNOSIS — I82402 Acute embolism and thrombosis of unspecified deep veins of left lower extremity: Secondary | ICD-10-CM

## 2014-04-18 DIAGNOSIS — N289 Disorder of kidney and ureter, unspecified: Secondary | ICD-10-CM

## 2014-04-18 DIAGNOSIS — I482 Chronic atrial fibrillation, unspecified: Secondary | ICD-10-CM

## 2014-04-18 DIAGNOSIS — F03918 Unspecified dementia, unspecified severity, with other behavioral disturbance: Secondary | ICD-10-CM

## 2014-04-18 NOTE — Progress Notes (Signed)
Patient ID: Alexis Archer, female   DOB: 1914/08/31, 81 y.o.   MRN: [id number]   This is a routine  visit.  Level care skilled.  Facility [facility].  Chief complaint-medical management of dementia hypertension glaucoma anemia osteoarthritis atrial fibrillation-left leg DVT  History of present illness.  Patient is a 81 year old female who has been a long-term resident of this facility-- Most recent acute issue is a left leg DVT she is nowXarelto appears to have tolerated this reasonably well-she does have a history of chronic bruising so has been   a balancing issue-however bruising does not appear grossly increased from baseline  In regards to other issues .  A couple months ago  had been sent to the ER for chest pain with no acute cardiology etiology found .    Chest x-ray did not show any acute process either.   .  She does have a history of significant dementia she is followed by psychiatric services and they have adjusted her Depakote.  She also has a history of osteoarthritis controlled currently with Tylenol.  She is on topical eyedrops for her glaucoma.   for osteoporosis continues on calcium.  She has a listed history of hypertension not on any medication and blood pressures appear to be stable most recently.110/57  . Family feels she has had some increased nasal congestion recently especially when eating.   also wondering if there could be any topical treatment for her arthritic pain at the knees.      Family medical social history is been reviewed per previous progress notes including 03/31/2014 as well as 03/21/2014  Medications have been reviewed per [facility].  Review of systems.  Unobtainable secondary to dementia please see history of present illness  Physical exam.  Most recent temperature was afebrile pulse 56 respirations 20 blood pressure 110/57-obtaining vital signs at times has been a challenge secondary to patient agitation   In general this  is a frail elderly female in no distress sitting comfortably in her wheelchair she denies any pain today.  Oropharynx appeared to be clear- did not note any nasal drainage  Her skin is warm and dry she does have some chronic bruising of all her extremities this does not appear to be new --this appears most prominent on her lower legs bilaterally.  Eyes she has prescription lenses pupils appear to be reactive visual acuity grossly intact.  Her chest is clear -there is no labored breathing but has quite shallow air entry  Heart is regular irregular rate and rhythm without murmur gallo p or rub  She still has some residual left lower extremity edema bit more than the right but this appears to be improved from exam last month this does not appear to be significantly tender or increased erythema   Her abdomen soft nontender with positive bowel  .Muscle skeletal moves all extremities 4 strength appears to be intact she does not really follow verbal commands-I do not note any deformities. other than arthritic  Neurologic is grossly intact she is agitated with attempts at invasive examination-cranial nerves appear to be grossly intact again strength appears to be intact I do not see any lateralizing findings.  Psych she does have severe dementia she is alert does not really follow verbal commands however.  Labs.  04/17/2014.  WBC 5.4 hemoglobin 9.7 platelets 264.  Sodium 145 potassium 4.4 BUN 25 creatinine 0.8  03/05/2014.  TSH-2.31.  WBC 7.4 hemoglobin 10.7 platelets 369.  Sodium 138 potassium 4.2 BUN 18 creatinine  0.7.  Albumin 2.3.  Otherwise liver function tests within normal limits.  Valproic acid level-17.8   labs obtained in the ER on November 12 showed a white count of 10.7 hemoglobin 11.0 platelets 381 neutrophils were somewhat high at 8.8.  Sodium was 139 potassium 4.5 BUN 20 creatinine 0.84 liver function tests appear to be unremarkable except for a albumin of  2.8.  Assessment and plan.  #1- History of left leg DVT-this appears stable she is on Xarelto--.    . #2history of atrial fibrillation continues onXarelto not on any rate control agent nonetheless her rate appears to be controlled continue to monitor this appears stable--I note she is also on aspirin--we'll discontinue this  . Dementia with behaviorsshe is followed by psychiatric services in the facility she is on Depakote apparently this has helped some.  #4-anemia of chronic disease this appears to be relatively stable hemoglobin at 9.7 appears recent hemoglobins have ranged between 10-11--at this point will monitor it does not appear to be greatly decreased from baseline   #5-history of edema-this appears to be relatively baseline at this point monitor     .  #6-history of glaucoma -- continues on topical eyedrops.  #7-hypertension listed history but this appears stable on no medications.  #8-apparently some history renal insufficiency at this point appears stable-creatinine on lab Jan 13th was 0.8 with BUN of 25   #9 .  Osteoporosis she does continue on calcium.  #10-history of osteoarthritis this appears to be controlled currently on Tylenol.-Family was wondering about a topical treatment for knees will write an order to see if therapy can provide bio freeze to see if this will give any relief.  #11 nasal congestion-apparently she is experiencing this intermittently will write for Flonase for 3 day course twice a day to see if this helps  [phone]

## 2014-04-22 ENCOUNTER — Non-Acute Institutional Stay (SKILLED_NURSING_FACILITY): Payer: Medicare Other | Admitting: Internal Medicine

## 2014-04-22 DIAGNOSIS — R233 Spontaneous ecchymoses: Secondary | ICD-10-CM

## 2014-04-22 DIAGNOSIS — I82402 Acute embolism and thrombosis of unspecified deep veins of left lower extremity: Secondary | ICD-10-CM

## 2014-04-22 DIAGNOSIS — T148XXA Other injury of unspecified body region, initial encounter: Secondary | ICD-10-CM

## 2014-04-22 DIAGNOSIS — R238 Other skin changes: Secondary | ICD-10-CM

## 2014-04-22 DIAGNOSIS — T148 Other injury of unspecified body region: Secondary | ICD-10-CM

## 2014-04-22 NOTE — Progress Notes (Signed)
Patient ID: Alexis Archer, female   DOB: [birth year], 81 y.o.   MRN: [id number]   this is an acute  visit.  Level care skilled.  Facility [facility].  Chief complaint--acute visit follow-up hematoma left axilla  History of present illness.  Patient is a 81 year old female who has been a long-term resident of this facility-- Most recent acute issue is a left leg DVT she is nowXarelto appears to have tolerated this despite concerns about her frequent bruising which have been chronic for some time  Apparently over the weekend patient developed a fairly large hematoma over left axilla area-an ultrasound shows most likely a hematoma-apparently there's been no history of fall.  She does have a history of significant dementia and cannot really give any review of systems blood per nursing appears to be at her baseline with no increased complaints of pain shortness of breath or arm discomfort.          Family medical social history is been reviewed per previous progress notes including 03/31/2014 as well as 03/21/2014  [facility]  Review of systems.  Unobtainable secondary to dementia please see history of present illness  Physical exam.  She is afebrile pulse 74 respirations 19 blood pressure is pending again somewhat difficult at times to obtain full set of vitals because of patient agitation   In general this is a frail elderly female in no distress sitting comfortably in her wheelchair she denies any pain today.  Oropharynx appeared to be clear- did not note any nasal drainage  Her skin is warm and dry she does have some chronic bruising of all her extremities this does not appear to be new --however she has a fairly large hematoma for left axilla area apparently this has not increased possibly decreased some since the weekend this is somewhat violaceous-brawny appearing slightly tender to palpation there also is a brawny type bruise across her upper  thorax.  Eyes she has prescription lenses pupils appear to be reactive visual acuity grossly intact.  Her chest is clear -there is no labored breathing but has quite shallow air entry  Heart is regular irregular rate and rhythm without murmur gallo p or rub  She still has some residual left lower extremity edema bit more than the right but this appears to be improved from exam last month this does not appear to be significantly tender or increased erythema   Her abdomen soft nontender with positive bowel  .Muscle skeletal moves all extremities 4 strength appears to be intact she does not really follow verbal commands-I do not note any deformities. other than arthritic  Neurologic is grossly intact she is agitated with attempts at invasive examination-cranial nerves appear to be grossly intact again strength appears to be intact I do not see any lateralizing findings.  Psych she does have severe dementia she is alert does not really follow verbal commands however.  Labs.  04/17/2014.  WBC 5.4 hemoglobin 9.7 platelets 264.  Sodium 145 potassium 4.4 BUN 25 creatinine 0.8  03/05/2014.  TSH-2.31.  WBC 7.4 hemoglobin 10.7 platelets 369.  Sodium 138 potassium 4.2 BUN 18 creatinine 0.7.  Albumin 2.3.  Otherwise liver function tests within normal limits.  Valproic acid level-17.8   labs obtained in the ER on November 12 showed a white count of 10.7 hemoglobin 11.0 platelets 381 neutrophils were somewhat high at 8.8.  Sodium was 139 potassium 4.5 BUN 20 creatinine 0.84 liver function tests appear to be unremarkable except for  a albumin of 2.8.  Assessment and plan.   #1-left axilla hematoma in thorax bruising-this is concerning since patient is on anticoagulation-certainly complicated with her history of A. fib and recent diagnosis of left lower extremity DVT-we'll discuss this with Dr. Lyn Hollingshead  I suspect we will discontinue the Xarelto and possibly stick to aspirin-but will  discuss this with Dr. Lyn Hollingshead.  She also will need a CBC and metabolic panel checked first thing tomorrow so we can follow-up on her hemoglobin in light of the hematoma.  Also will obtain an x-ray of the chest just to rule out any possible rib fracture or trauma causing bruising although I suspect this could largely be anticoagulation related.  Clinically she appears to be stable although this bruising is concerning again will have to readdress her anticoagulation.   [id number]

## 2014-04-25 ENCOUNTER — Encounter: Payer: Self-pay | Admitting: Internal Medicine

## 2014-04-25 ENCOUNTER — Non-Acute Institutional Stay (SKILLED_NURSING_FACILITY): Payer: Medicare Other | Admitting: Internal Medicine

## 2014-04-25 DIAGNOSIS — E870 Hyperosmolality and hypernatremia: Secondary | ICD-10-CM

## 2014-04-25 DIAGNOSIS — D62 Acute posthemorrhagic anemia: Secondary | ICD-10-CM | POA: Insufficient documentation

## 2014-04-25 DIAGNOSIS — I482 Chronic atrial fibrillation, unspecified: Secondary | ICD-10-CM

## 2014-04-25 DIAGNOSIS — D649 Anemia, unspecified: Secondary | ICD-10-CM

## 2014-04-25 DIAGNOSIS — I82402 Acute embolism and thrombosis of unspecified deep veins of left lower extremity: Secondary | ICD-10-CM

## 2014-04-25 NOTE — Progress Notes (Signed)
Patient ID: Alexis Archer, female   DOB: 17-Apr-1914, 81 y.o.   MRN: [id number]   this is Ferman Hammingan acute  visit.  Level care skilled.  Facility [facility].  Chief complaint--acute visit follow-up hypernatremia-anemia-left axilla hematoma  History of present illness.  Patient is a 81 year old female who has been a long-term resident of this facility--she has a history of significant dementia as well as atrial fibrillation among other issues a Most recent acute issue is a left leg DVT she was started on  Xarelto however    patient developed a fairly large hematoma over left axilla area-an ultrasound showed most likely a hematoma-apparently there's been no history of fall--  Labs were ordered including a CBC which showed a significant anemia of 6.8-this was addressed by Dr Lyn HollingsheadAlexander yesterday and she discontinued the Rob BuntingXarelto-she discuss this with her daughter who was in the facility yesterday.  I also discussed this with her daughter in the facility today-I did reiterate that this is a challenging situation with patient's recent DVT and atrial fibrillation-but the risk of continuing anticoagulation with her reduced hemoglobin was more significant and she expressed understanding.--.  Another issue per lab obtain this afternoon was significant hypernatremia of 163-she does have borderline hypernatremia running in the mid high 140s generally and this is been stable but this is a significant increase.  I did discuss this with her daughter via phone later in the day with when I was made aware of the lab and again she expressed desirestoa keep her mother out of the hospital and fairly conservative   treatment with emphasis on comfort  Her vital signs continued to be stable she is essentially at baseline but the elevated sodium obviously is a concern.    Family medical social history is been reviewed per previous progress notes including 03/31/2014 as well as 03/21/2014  [facility]  Review of systems.  Unobtainable secondary to dementia please see history of present illness--per nursing staff patient remains at baseline  Physical exam.  Temperature is 97.0 pulse is 60 respirations 18 blood pressure 98/60    In general this is a frail elderly female in no distress sitting comfortably in her wheelchair she denies any pain today.  Oropharynx appeared to be clear- did not note any nasal drainage  Her skin is warm and dry she does have some chronic bruising of all her extremities this does not appear to be new --however she has a  raised hematoma  left axilla areabut this appears to gradually going down--in  fact looks smaller than when I saw her earlier this week--also continues to have bruising across her thorax although this appears to be resolving as well .  Eyes she has prescription lenses pupils appear to be reactive visual acuity grossly intact.  Her chest is clear -there is no labored breathing but has quite shallow air entry  Heart is regular irregular rate and rhythm without murmur gallo p or rub  She still has some residual left lower extremity edema bit more than the right but this appears to be improved from exam last month this does not appear to be significantly tender or increased erythema   Her abdomen soft nontender with positive bowel  .Muscle skeletal moves all extremities 4 strength  appears to be intact she does not really follow verbal commands-I do not note any deformities. other than arthritic  Neurologic is grossly intact she is agitated with attempts at invasive examination-cranial nerves appear to be grossly  intact again strength appears to be intact I do not see any lateralizing findings.  Psych she does have severe dementia she is alert does not really follow verbal commands however.  Labs.  04/25/2014.  Sodium 163 potassium 4.1 BUN 51 creatinine 1.1.  WBC 7.7 hemoglobin 7.3 platelets  355.    04/17/2014.  WBC 5.4 hemoglobin 9.7 platelets 264.  Sodium 145 potassium 4.4 BUN 25 creatinine 0.8  03/05/2014.  TSH-2.31.  WBC 7.4 hemoglobin 10.7 platelets 369.  Sodium 138 potassium 4.2 BUN 18 creatinine 0.7.  Albumin 2.3.  Otherwise liver function tests within normal limits.  Valproic acid level-17.8   labs obtained in the ER on November 12 showed a white count of 10.7 hemoglobin 11.0 platelets 381 neutrophils were somewhat high at 8.8.  Sodium was 139 potassium 4.5 BUN 20 creatinine 0.84 liver function tests appear to be unremarkable except for a albumin of 2.8.  Assessment and plan.   #1-significant hypernatremia-as stated above this was discussed extensively with her daughter-I also discussed this with Dr. Lyn Hollingshead via phone-will start half normal saline at 100 mL an hour and obtain a BMP after 2 L as well as a CBC-hopefully will patient's dementia she will tolerate IV therapy and I have discussed this with nursing-fluids will have to be encouraged and this also was discussed with nursing.  Will have to monitor her vital signs every shift with pulse ox.  #2 anemia-this was discussed with her daughter earlier today in the facility-Dr. Lyn Hollingshead also discuss this with her daughter yesterday-the Xarelto and aspirin have been discontinued-again this is a risk-benefit situation-as discussed with her daughter today it is suspect the risk of bleeding and reduced hemoglobin is greater than the risk of a pulmonary embolism although this certainly cannot be guaranteed and her daughter certainly aware of this as well per our discussion--and she is comfortable with this.  Again she desires largely conservative treatment and keeping her mother out of the hospital since it is quite jarring experience for someone with her level of dementia.  The hemoglobin updated actually has shown improvement with a level 7.3 which is up from 6.8 which was earlier this week again  anticoagulation has been DC'd and hopefully this is having the desired effect.--This will warrentcontinued monitoring but it looks like it is trending in the right direction  [id number]-of note greater than 40 minutes spent assessing patient-discussing her status with nursing staff as well as with her daughter 2 once in the facility and once via phone-and coordinating and formulating a plan of care for numerous diagnoses.  Of note greater than 50% of time spent coordinating plan of care with familyinput     [id number]

## 2014-04-29 ENCOUNTER — Non-Acute Institutional Stay (SKILLED_NURSING_FACILITY): Payer: Medicare Other | Admitting: Internal Medicine

## 2014-04-29 ENCOUNTER — Encounter: Payer: Self-pay | Admitting: Internal Medicine

## 2014-04-29 DIAGNOSIS — F03918 Unspecified dementia, unspecified severity, with other behavioral disturbance: Secondary | ICD-10-CM

## 2014-04-29 DIAGNOSIS — R238 Other skin changes: Secondary | ICD-10-CM

## 2014-04-29 DIAGNOSIS — R233 Spontaneous ecchymoses: Secondary | ICD-10-CM

## 2014-04-29 DIAGNOSIS — E870 Hyperosmolality and hypernatremia: Secondary | ICD-10-CM

## 2014-04-29 DIAGNOSIS — F0391 Unspecified dementia with behavioral disturbance: Secondary | ICD-10-CM

## 2014-04-29 DIAGNOSIS — D62 Acute posthemorrhagic anemia: Secondary | ICD-10-CM

## 2014-04-29 DIAGNOSIS — Z710 Person encountering health services to consult on behalf of another person: Secondary | ICD-10-CM

## 2014-04-29 NOTE — Progress Notes (Signed)
Patient ID: Alexis Archer, female   DOB: 1914-06-13, 81 y.o.   MRN: [id number]   MRN: [id number] Name: Alexis Archer  Sex: female Age: 81 y.o. DOB: 19-Jul-1914  [facility] Facility/Room: 407 Level Of Care: SNF Provider: Roena Archer Emergency Contacts: Extended Emergency Contact Information Primary Emergency Contact: Archer,Pat(Poa) Address: [address]          [place], [place] [address] [place] Home Phone: [phone] Work Phone: [phone] Mobile Phone: [phone] Relation: Daughter  Code Status: DNR  Allergies: Review of patient's allergies indicates no known allergies.  Chief Complaint  Patient presents with  . Acute Visit    HPI: Patient is 81 y.o. female who is being seen for a drop in Hb after being started on xarelto for new DVT.  Past Medical History  Diagnosis Date  . Hypertension   . Glaucoma   . GERD (gastroesophageal reflux disease)   . Anemia   . Osteoarthritis     No past surgical history on file.    Medication List       This list is accurate as of: 04/29/14 11:59 PM.  Always use your most recent med list.               acetaminophen 500 MG tablet  Commonly known as:  TYLENOL  Take 500 mg by mouth 3 (three) times daily.     calcitonin (salmon) 200 UNIT/ACT nasal spray  Commonly known as:  MIACALCIN/FORTICAL  Place 1 spray into the nose daily. Alternates nostrils every day     calcium-vitamin D 500-200 MG-UNIT per tablet  Commonly known as:  OSCAL WITH D  Take 1 tablet by mouth 3 (three) times daily.     COMBIGAN 0.2-0.5 % ophthalmic solution  Generic drug:  brimonidine-timolol  Place 1 drop into both eyes every 12 (twelve) hours.     diclofenac sodium 1 % Gel  Commonly known as:  VOLTAREN  Apply 2 g topically 4 (four) times daily. Apply to  knees 4 times a day as needed for pain     divalproex 125 MG DR tablet  Commonly known as:  DEPAKOTE  Take 250 mg by mouth 3 (three) times daily.     latanoprost 0.005 % ophthalmic solution  Commonly known as:  XALATAN  Place 1 drop into both eyes at bedtime.     LORazepam 1 MG tablet  Commonly known as:  ATIVAN  1 tablet by mouth prior to dental procedure on 01/04/13 as directed by staff        No orders of the defined types were placed in this encounter.    Immunization History  Administered Date(s) Administered  . Influenza Whole 01/14/2013  . Influenza-Unspecified 01/15/2014    History  Substance Use Topics  . Smoking status: Never Smoker   . Smokeless tobacco: Not on file  . Alcohol Use: No    Review of Systems  DATA OBTAINED: from, nurse GENERAL:  no fevers, fatigue,+ appetite changes SKIN: No itching, rash;bruising on torso from fall HEENT: No complaint RESPIRATORY: No cough, wheezing, SOB CARDIAC: No chest pain, palpitations, lower extremity edema  GI: No abdominal pain, No N/V/D or constipation, No heartburn or reflux  GU: No dysuria, frequency or urgency, or incontinence  MUSCULOSKELETAL: No unrelieved bone/joint pain NEUROLOGIC: No headache, dizziness  PSYCHIATRIC: dementia  There were no vitals filed for this visit.  Physical Exam  GENERAL APPEARANCE: Alert,non conversant, No acute distress  SKIN:large bruising with some  minor resolution and ant and post torso and arms HEENT: Unremarkable RESPIRATORY: Breathing is even, unlabored. Lung sounds are clear   CARDIOVASCULAR: Heart RRR no murmurs, rubs or gallops. No peripheral edema  GASTROINTESTINAL: Abdomen is soft, non-tender, not distended w/ normal bowel sounds; no CVA tender  GENITOURINARY: Bladder non tender, not distended  MUSCULOSKELETAL: No abnormal joints or musculature NEUROLOGIC: Cranial nerves 2-12 grossly intact PSYCHIATRIC: dementia, doesn't like to be touched.  Patient Active Problem List   Diagnosis Date Noted  . Encounter for family conference without patient present 05/01/2014  . Hypernatremia 05/01/2014  . Acute blood loss  anemia 04/25/2014  . DVT (deep venous thrombosis) 03/21/2014  . Tremor 12/11/2013  . Speech abnormality 12/11/2013  . Bruises easily 11/29/2013  . Renal insufficiency 10/01/2013  . Dementia with behavioral disturbance 10/01/2013  . Atrial fibrillation 08/30/2013  . Cellulitis 07/30/2013  . Fever, unspecified 05/23/2013  . Cough 05/23/2013  . Edema 10/19/2012  . Depression 10/19/2012  . Glaucoma 10/19/2012  . Essential hypertension, benign 08/11/2012  . Osteoarthritis 08/11/2012  . Osteoporosis, unspecified 08/11/2012  . Hypopotassemia 08/11/2012    CBC    Component Value Date/Time   WBC 16.3* 03/26/2011 2338   RBC 3.95 03/26/2011 2338   HGB 11.7* 03/26/2011 2338   HCT 35.2* 03/26/2011 2338   PLT 244 03/26/2011 2338   MCV 89.1 03/26/2011 2338   LYMPHSABS 0.8 11/26/2009 1724   MONOABS 1.0 11/26/2009 1724   EOSABS 0.3 11/26/2009 1724   BASOSABS 0.0 11/26/2009 1724    CMP     Component Value Date/Time   NA 137 03/26/2011 2338   K 4.9 03/26/2011 2338   CL 104 03/26/2011 2338   CO2 22 03/26/2011 2338   GLUCOSE 98 03/26/2011 2338   BUN 61* 03/26/2011 2338   CREATININE 1.29* 03/26/2011 2338   CALCIUM 9.0 03/26/2011 2338   PROT 6.0 11/26/2009 1724   ALBUMIN 3.2* 11/26/2009 1724   AST 19 11/26/2009 1724   ALT 14 11/26/2009 1724   ALKPHOS 75 11/26/2009 1724   BILITOT 0.9 11/26/2009 1724   GFRNONAA 34* 03/26/2011 2338   GFRAA 40* 03/26/2011 2338    Assessment and Plan  No problem-specific assessment & plan notes found for this encounter.  Pt seen 04/24/2014      This encounter was created in error - please disregard.

## 2014-04-29 NOTE — Progress Notes (Signed)
MRN: [id number] Name: Alexis Archer  Sex: female Age: 81 y.o. DOB: 11-22-1914  [facility] Facility/Room: 407 Level Of Care: SNF Provider: Merrilee Seashore D Emergency Contacts: Extended Emergency Contact Information Primary Emergency Contact: Alexis Archer,Pat(Poa) Address: [address]          [place], [place] [address] [place] Home Phone: [phone] Work Phone: [phone] Mobile Phone: [phone] Relation: Daughter  Code Status: DNR  Allergies: Review of patient's allergies indicates no known allergies.  Chief Complaint  Patient presents with  . Acute Visit    HPI: Patient is 81 y.o. female who is being seen for a drop in Hb after being started on xarelto for new DVT.  Past Medical History  Diagnosis Date  . Hypertension   . Glaucoma   . GERD (gastroesophageal reflux disease)   . Anemia   . Osteoarthritis     No past surgical history on file.    Medication List       This list is accurate as of: 04/29/14 11:59 PM.  Always use your most recent med list.               acetaminophen 500 MG tablet  Commonly known as:  TYLENOL  Take 500 mg by mouth 3 (three) times daily.     calcitonin (salmon) 200 UNIT/ACT nasal spray  Commonly known as:  MIACALCIN/FORTICAL  Place 1 spray into the nose daily. Alternates nostrils every day     calcium-vitamin D 500-200 MG-UNIT per tablet  Commonly known as:  OSCAL WITH D  Take 1 tablet by mouth 3 (three) times daily.     COMBIGAN 0.2-0.5 % ophthalmic solution  Generic drug:  brimonidine-timolol  Place 1 drop into both eyes every 12 (twelve) hours.     diclofenac sodium 1 % Gel  Commonly known as:  VOLTAREN  Apply 2 g topically 4 (four) times daily. Apply to  knees 4 times a day as needed for pain     divalproex 125 MG DR tablet  Commonly known as:  DEPAKOTE  Take 250 mg by mouth 3 (three) times daily.     latanoprost 0.005 % ophthalmic solution  Commonly known as:  XALATAN  Place 1  drop into both eyes at bedtime.     LORazepam 1 MG tablet  Commonly known as:  ATIVAN  1 tablet by mouth prior to dental procedure on 01/04/13 as directed by staff        No orders of the defined types were placed in this encounter.    Immunization History  Administered Date(s) Administered  . Influenza Whole 01/14/2013  . Influenza-Unspecified 01/15/2014    History  Substance Use Topics  . Smoking status: Never Smoker   . Smokeless tobacco: Not on file  . Alcohol Use: No    Review of Systems  DATA OBTAINED: from, nurse GENERAL:  no fevers, fatigue,+ appetite changes SKIN: No itching, rash;bruising on torso from fall HEENT: No complaint RESPIRATORY: No cough, wheezing, SOB CARDIAC: No chest pain, palpitations, lower extremity edema  GI: No abdominal pain, No N/V/D or constipation, No heartburn or reflux  GU: No dysuria, frequency or urgency, or incontinence  MUSCULOSKELETAL: No unrelieved bone/joint pain NEUROLOGIC: No headache, dizziness  PSYCHIATRIC: dementia  Filed Vitals:   04/29/14 1759  BP: 143/72  Pulse: 88  Temp: 98.8 F (37.1 C)  Resp: 16    Physical Exam  GENERAL APPEARANCE: Alert,non conversant, No acute distress  SKIN:large bruising with some minor  resolution and ant and post torso and arms HEENT: Unremarkable RESPIRATORY: Breathing is even, unlabored. Lung sounds are clear   CARDIOVASCULAR: Heart RRR no murmurs, rubs or gallops. No peripheral edema  GASTROINTESTINAL: Abdomen is soft, non-tender, not distended w/ normal bowel sounds; no CVA tender  GENITOURINARY: Bladder non tender, not distended  MUSCULOSKELETAL: No abnormal joints or musculature NEUROLOGIC: Cranial nerves 2-12 grossly intact PSYCHIATRIC: dementia, doesn't like to be touched.  Patient Active Problem List   Diagnosis Date Noted  . Encounter for family conference without patient present 05/01/2014  . Acute blood loss anemia 04/25/2014  . DVT (deep venous thrombosis)  03/21/2014  . Tremor 12/11/2013  . Speech abnormality 12/11/2013  . Bruises easily 11/29/2013  . Renal insufficiency 10/01/2013  . Dementia with behavioral disturbance 10/01/2013  . Atrial fibrillation 08/30/2013  . Cellulitis 07/30/2013  . Fever, unspecified 05/23/2013  . Cough 05/23/2013  . Edema 10/19/2012  . Depression 10/19/2012  . Glaucoma 10/19/2012  . Essential hypertension, benign 08/11/2012  . Osteoarthritis 08/11/2012  . Osteoporosis, unspecified 08/11/2012  . Hypopotassemia 08/11/2012    CBC    Component Value Date/Time   WBC 16.3* 03/26/2011 2338   RBC 3.95 03/26/2011 2338   HGB 11.7* 03/26/2011 2338   HCT 35.2* 03/26/2011 2338   PLT 244 03/26/2011 2338   MCV 89.1 03/26/2011 2338   LYMPHSABS 0.8 11/26/2009 1724   MONOABS 1.0 11/26/2009 1724   EOSABS 0.3 11/26/2009 1724   BASOSABS 0.0 11/26/2009 1724    CMP     Component Value Date/Time   NA 137 03/26/2011 2338   K 4.9 03/26/2011 2338   CL 104 03/26/2011 2338   CO2 22 03/26/2011 2338   GLUCOSE 98 03/26/2011 2338   BUN 61* 03/26/2011 2338   CREATININE 1.29* 03/26/2011 2338   CALCIUM 9.0 03/26/2011 2338   PROT 6.0 11/26/2009 1724   ALBUMIN 3.2* 11/26/2009 1724   AST 19 11/26/2009 1724   ALT 14 11/26/2009 1724   ALKPHOS 75 11/26/2009 1724   BILITOT 0.9 11/26/2009 1724   GFRNONAA 34* 03/26/2011 2338   GFRAA 40* 03/26/2011 2338    Assessment and Plan  Acute blood loss anemia Pt fell out of bed several days ago and sustained large bruising without any fractures. However, pt's Hb dropped from 9.7 to 6.8 in 7 days, presumably from blood loss into soft tissue and skin. Pt had been started on xarelto for new dx of DVT and ASA decreased to low dose. Both have been discontinued 2/2 to decrease in Hb from bleeding.Pt's VS are stable, pt with no respiratory distress. Pt denied new pain anywhere but not impossible pt has a blood collection somewhere. The question would be what if anything to do about it in pt's  condition of declining dementia.Will need to speak to pt's RP, her daughter about work-up or transfusion.   Encounter for family conference without patient present I spoke with pt's daughter over the phone at length about possibility of blood collection somewhere besides skin and potential need for transfusion. Daughter discussed how traumatic it is for pt to leave SNF and [facility]. I agree with her as a diagnosis but probably not result in intervention in this 81 yo with dementia. Will plan to repeat Hb tomorrow and continue to monitor and no intervention if Hb remains stable.    Pt seen 04/24/2014 Margit HanksALEXANDER, Ivionna Verley D, MD

## 2014-04-30 ENCOUNTER — Non-Acute Institutional Stay (SKILLED_NURSING_FACILITY): Payer: Medicare Other | Admitting: Internal Medicine

## 2014-04-30 DIAGNOSIS — Z710 Person encountering health services to consult on behalf of another person: Secondary | ICD-10-CM

## 2014-04-30 DIAGNOSIS — E870 Hyperosmolality and hypernatremia: Secondary | ICD-10-CM

## 2014-04-30 DIAGNOSIS — F03918 Unspecified dementia, unspecified severity, with other behavioral disturbance: Secondary | ICD-10-CM

## 2014-04-30 DIAGNOSIS — F0391 Unspecified dementia with behavioral disturbance: Secondary | ICD-10-CM

## 2014-04-30 DIAGNOSIS — D62 Acute posthemorrhagic anemia: Secondary | ICD-10-CM

## 2014-04-30 NOTE — Progress Notes (Signed)
MRN: [id number] Name: Alexis HammingDorothy Archer  Sex: female Age: 81 y.o. DOB: 05-28-1914  PSC #: [facility] Facility/Room:407 Level Of Care: SNF Provider: Merrilee SeashoreALEXANDER, Alenna Russell D Emergency Contacts: Extended Emergency Contact Information Primary Emergency Contact: Roundtree,Pat(Poa) Address: [address]          [place], [place] [address] [place] Home Phone: [phone] Work Phone: [phone] Mobile Phone: [phone] Relation: Daughter  Code Status: DNR  Allergies: Review of patient's allergies indicates no known allergies.  Chief Complaint  Patient presents with  . Acute Visit    HPI: Patient is 81 y.o. female who is being seen acutely for hypernatremia and need for PICC  Past Medical History  Diagnosis Date  . Hypertension   . Glaucoma   . GERD (gastroesophageal reflux disease)   . Anemia   . Osteoarthritis     No past surgical history on file.    Medication List       This list is accurate as of: 04/30/14 11:59 PM.  Always use your most recent med list.               acetaminophen 500 MG tablet  Commonly known as:  TYLENOL  Take 500 mg by mouth 3 (three) times daily.     calcitonin (salmon) 200 UNIT/ACT nasal spray  Commonly known as:  MIACALCIN/FORTICAL  Place 1 spray into the nose daily. Alternates nostrils every day     calcium-vitamin D 500-200 MG-UNIT per tablet  Commonly known as:  OSCAL WITH D  Take 1 tablet by mouth 3 (three) times daily.     COMBIGAN 0.2-0.5 % ophthalmic solution  Generic drug:  brimonidine-timolol  Place 1 drop into both eyes every 12 (twelve) hours.     diclofenac sodium 1 % Gel  Commonly known as:  VOLTAREN  Apply 2 g topically 4 (four) times daily. Apply to  knees 4 times a day as needed for pain     divalproex 125 MG DR tablet  Commonly known as:  DEPAKOTE  Take 250 mg by mouth 3 (three) times daily.     latanoprost 0.005 % ophthalmic solution  Commonly known as:  XALATAN  Place 1 drop into both eyes  at bedtime.     LORazepam 1 MG tablet  Commonly known as:  ATIVAN  1 tablet by mouth prior to dental procedure on 01/04/13 as directed by staff        No orders of the defined types were placed in this encounter.    Immunization History  Administered Date(s) Administered  . Influenza Whole 01/14/2013  . Influenza-Unspecified 01/15/2014    History  Substance Use Topics  . Smoking status: Never Smoker   . Smokeless tobacco: Not on file  . Alcohol Use: No    Review of Systems  DATA OBTAINED: from nurse,  GENERAL:  no fevers, fatigue+ appetite changes SKIN: No itching, rash; bruising from fall HEENT: No complaint RESPIRATORY: No cough, wheezing, SOB CARDIAC: No chest pain, palpitations, lower extremity edema  GI: No abdominal pain, No N/V/D or constipation, No heartburn or reflux  GU: No dysuria, frequency or urgency, or incontinence  MUSCULOSKELETAL: No unrelieved bone/joint pain NEUROLOGIC: No headache, or focal weakness  PSYCHIATRIC: dementia  Filed Vitals:   04/30/14 2118  BP: 98/68  Pulse: 58  Temp: 97.5 F (36.4 C)  Resp: 16    Physical Exam  GENERAL APPEARANCE: Alert,non conversant, No acute distress; curled up in bed in a ball;she did acknowledge me  SKIN: large bruising, resolving HEENT: Unremarkable RESPIRATORY: Breathing is even, unlabored. Lung sounds are clear   CARDIOVASCULAR: Heart RRR no murmurs, rubs or gallops. No peripheral edema  GASTROINTESTINAL: Abdomen is soft, non-tender, not distended w/ normal bowel sounds.  GENITOURINARY: Bladder non tender, not distended  MUSCULOSKELETAL: No abnormal joints or musculature NEUROLOGIC: Cranial nerves 2-12 grossly intact. PSYCHIATRIC: dementia, doesn't like to be touched  Patient Active Problem List   Diagnosis Date Noted  . Encounter for family conference without patient present 05/01/2014  . Hypernatremia 05/01/2014  . Acute blood loss anemia 04/25/2014  . DVT (deep venous thrombosis) 03/21/2014   . Tremor 12/11/2013  . Speech abnormality 12/11/2013  . Bruises easily 11/29/2013  . Renal insufficiency 10/01/2013  . Dementia with behavioral disturbance 10/01/2013  . Atrial fibrillation 08/30/2013  . Cellulitis 07/30/2013  . Fever, unspecified 05/23/2013  . Cough 05/23/2013  . Edema 10/19/2012  . Depression 10/19/2012  . Glaucoma 10/19/2012  . Essential hypertension, benign 08/11/2012  . Osteoarthritis 08/11/2012  . Osteoporosis, unspecified 08/11/2012  . Hypopotassemia 08/11/2012    CBC    Component Value Date/Time   WBC 16.3* 03/26/2011 2338   RBC 3.95 03/26/2011 2338   HGB 11.7* 03/26/2011 2338   HCT 35.2* 03/26/2011 2338   PLT 244 03/26/2011 2338   MCV 89.1 03/26/2011 2338   LYMPHSABS 0.8 11/26/2009 1724   MONOABS 1.0 11/26/2009 1724   EOSABS 0.3 11/26/2009 1724   BASOSABS 0.0 11/26/2009 1724    CMP     Component Value Date/Time   NA 137 03/26/2011 2338   K 4.9 03/26/2011 2338   CL 104 03/26/2011 2338   CO2 22 03/26/2011 2338   GLUCOSE 98 03/26/2011 2338   BUN 61* 03/26/2011 2338   CREATININE 1.29* 03/26/2011 2338   CALCIUM 9.0 03/26/2011 2338   PROT 6.0 11/26/2009 1724   ALBUMIN 3.2* 11/26/2009 1724   AST 19 11/26/2009 1724   ALT 14 11/26/2009 1724   ALKPHOS 75 11/26/2009 1724   BILITOT 0.9 11/26/2009 1724   GFRNONAA 34* 03/26/2011 2338   GFRAA 40* 03/26/2011 2338    Assessment and Plan  Hypernatremia Initial contact with this problem was per phone on call. Pt had BMP drawn and Na+ was 163. Pt received 2 liters 1/2NS per clysis and repeat Na+ was still 163 .Another liter to be given and will have to start. thinking of ultimate endpoint. On arrival to SNF today nursing asked me to order PICC on pt because she has no access. Pt is not eating or drinking and has noticeably declined since last visit. Will discuss with RP.   Encounter for family conference without patient present I spoke with pt's daughter at length about the current situation. She  was aware her mother had received IVF over the weekend and had seen Mom today at lunch in bed rolled up in a ball. She was aware of pt's decrease in po intake. We discussed the current situation and how IVF was delaying the inevitable and she understood. I described a PICC line to her and she understood why I counseled against it, although we would go there if the daughter needed to buy a little more time. However, she wishes for Mom's suffering not to be prolonged, no PICC. Would still like staff to try to feed and get her to drink when possible.   Dementia with behavioral disturbance Pt is declining, no interest in eating or drinking. Comfort.     Margit Hanks, MD

## 2014-05-01 ENCOUNTER — Encounter: Payer: Self-pay | Admitting: Internal Medicine

## 2014-05-01 DIAGNOSIS — Z710 Person encountering health services to consult on behalf of another person: Secondary | ICD-10-CM | POA: Insufficient documentation

## 2014-05-01 DIAGNOSIS — E870 Hyperosmolality and hypernatremia: Secondary | ICD-10-CM | POA: Insufficient documentation

## 2014-05-01 NOTE — Assessment & Plan Note (Addendum)
Pt fell out of bed several days ago and sustained large bruising without any fractures. However, pt's Hb dropped from 9.7 to 6.8 in 7 days, presumably from blood loss into soft tissue and skin. Pt had been started on xarelto for new dx of DVT and ASA decreased to low dose. Both have been discontinued 2/2 to decrease in Hb from bleeding.Pt's VS are stable, pt with no respiratory distress. Pt denied new pain anywhere but not impossible pt has a blood collection somewhere. The question would be what if anything to do about it in pt's condition of declining dementia.Will need to speak to pt's RP, her daughter about work-up or transfusion.

## 2014-05-01 NOTE — Assessment & Plan Note (Addendum)
I spoke with pt's daughter at length about the current situation. She was aware her mother had received IVF over the weekend and had seen Mom today at lunch in bed rolled up in a ball. She was aware of pt's decrease in po intake. We discussed the current situation and how IVF was delaying the inevitable and she understood. I described a PICC line to her and she understood why I counseled against it, although we would go there if the daughter needed to buy a little more time. However, she wishes for Mom's suffering not to be prolonged, no PICC. Would still like staff to try to feed and get her to drink when possible.

## 2014-05-01 NOTE — Assessment & Plan Note (Signed)
Initial contact with this problem was per phone on call. Pt had BMP drawn and Na+ was 163. Pt received 2 liters 1/2NS per clysis and repeat Na+ was still 163 .Another liter to be given and will have to start. thinking of ultimate endpoint. On arrival to SNF today nursing asked me to order PICC on pt because she has no access. Pt is not eating or drinking and has noticeably declined since last visit. Will discuss with RP.

## 2014-05-01 NOTE — Assessment & Plan Note (Addendum)
I spoke with pt's daughter over the phone at length about possibility of blood collection somewhere besides skin and potential need for transfusion. Daughter discussed how traumatic it is for pt to leave SNF and would [facility]. I agree with her as a diagnosis but probably not result in intervention in this 81 yo with dementia. Will plan to repeat Hb tomorrow and continue to monitor and no intervention if Hb remains stable.

## 2014-05-01 NOTE — Assessment & Plan Note (Deleted)
Pt fell out of bed several days ago and sustained large bruising without any fractures. However, pt's Hb dropped from 9.7 to 6.8 in 7 days, presumably from blood loss into soft tissue and skin. Pt denied new pain anywhere but not impossible pt has a blood collection somewhere. The question would be what if anything to do about it in pt's condition of declining dementia.Will need to speak to pt's RP, her daughter about work-up or transfusion.

## 2014-05-01 NOTE — Assessment & Plan Note (Addendum)
Pt is declining, decreased interest in eating or drinking. Comfort.

## 2014-05-02 ENCOUNTER — Non-Acute Institutional Stay (SKILLED_NURSING_FACILITY): Payer: Medicare Other | Admitting: Internal Medicine

## 2014-05-02 DIAGNOSIS — F03918 Unspecified dementia, unspecified severity, with other behavioral disturbance: Secondary | ICD-10-CM

## 2014-05-02 DIAGNOSIS — E870 Hyperosmolality and hypernatremia: Secondary | ICD-10-CM

## 2014-05-02 DIAGNOSIS — F0391 Unspecified dementia with behavioral disturbance: Secondary | ICD-10-CM

## 2014-05-02 DIAGNOSIS — R627 Adult failure to thrive: Secondary | ICD-10-CM

## 2014-05-02 NOTE — Progress Notes (Signed)
Patient ID: Alexis Archer, female   DOB: March 11, 1915, 81 y.o.   MRN: [id number]   this is an acute visit.  Level care skilled.  Facility [facility]  Patient presents with  . Acute Visit Follow-up failure to thrive hypernatremia    HPI: Patient is 81 y.o. female who has been declining recently- she has a history of end-stage dementia and recently has not been eating or drinking much at all her sodium has risen to 163- this has remained at that level despite receiving IV fluids-Dr. Lyn Hollingshead did speak with her daughter earlier this week  And daughter as indicated she would prefer comfort care- her daughters in the facility again today and she has reiterated this to me - I also discussed with her the possibility of a hospice consult - and she is open to this - in fact by the end of the day hospice had arrived here and she will be followed by them as well.   I also discussed with daughter about possibility of ordering further labs   She is comfortable with not getting any further labs since her mother is now essentially comfort care - in addition to the sodium her hemoglobin had dropped to under 7 but appears to be rising recently and is now up to 8-again would not really be following labs anymore secondary to comfort care measures per discussion with her daughter today.      Past Medical History  Diagnosis Date  . Hypertension   . Glaucoma   . GERD (gastroesophageal reflux disease)   . Anemia   . Osteoarthritis     No past surgical history on file.    Medication List               acetaminophen 500 MG tablet  Commonly known as:  TYLENOL  Take 500 mg by mouth 3 (three) times daily.     calcitonin (salmon) 200 UNIT/ACT nasal spray  Commonly known as:  MIACALCIN/FORTICAL  Place 1 spray into the nose daily. Alternates nostrils every day     calcium-vitamin D 500-200 MG-UNIT per tablet  Commonly known as:  OSCAL WITH D  Take 1 tablet by mouth 3 (three) times daily.     COMBIGAN 0.2-0.5 % ophthalmic solution  Generic drug:  brimonidine-timolol  Place 1 drop into both eyes every 12 (twelve) hours.     diclofenac sodium 1 % Gel  Commonly known as:  VOLTAREN  Apply 2 g topically 4 (four) times daily. Apply to  knees 4 times a day as needed for pain     divalproex 125 MG DR tablet  Commonly known as:  DEPAKOTE  Take 250 mg by mouth 3 (three) times daily.     latanoprost 0.005 % ophthalmic solution  Commonly known as:  XALATAN  Place 1 drop into both eyes at bedtime.     LORazepam 1 MG tablet  Commonly known as:  ATIVAN  1 tablet by mouth prior to dental procedure on 01/04/13 as directed by staff        No orders of the defined types were placed in this encounter.    Immunization History  Administered Date(s) Administered  . Influenza Whole 01/14/2013  . Influenza-Unspecified 01/15/2014    History  Substance Use Topics  . Smoking status: Never Smoker   . Smokeless tobacco: Not on file  . Alcohol Use: No    Review of Systems  DATA OBTAINED: from nurse,  GENERAL:  no fevers, fatigue+ appetite changes SKIN:  No itching, rash; bruising from fall HEENT: No complaint RESPIRATORY: No cough, wheezing, SOB CARDIAC: No chest pain, palpitations, lower extremity edema  GI: No abdominal pain, No N/V/D or constipation, No heartburn or reflux  GU: No dysuria, frequency or urgency, or incontinence  MUSCULOSKELETAL: No unrelieved bone/joint pain NEUROLOGIC: No headache, or focal weakness  PSYCHIATRIC: dementia                      Physical Exam  pulse is 84 respirations 18 and temperature blood pressure of been difficult to obtain secondary to patient agitation but this is nothing new GENERAL APPEARANCE: Alert,non conversant, No acute distress; she actually appears a bit more responsive than she did earlier this week-- actually answering my questions somewhat SKIN: large bruising, resolving the hematoma left axilla appears to be  significantly smaller in size than when initially evaluated HEENT: Unremarkable RESPIRATORY: Breathing is even, unlabored. Lung sounds are clear   CARDIOVASCULAR: Heart RRR no murmurs, rubs or gallops. baselineperipheral edema  GASTROINTESTINAL: Abdomen is soft, non-tender, not distended w/ normal bowel sounds.  GENITOURINARY: Bladder non tender, not distended  MUSCULOSKELETAL: No abnormal joints or musculature NEUROLOGIC: Cranial nerves 2-12 grossly intact. PSYCHIATRIC: dementia, doesn't like to be touched  Patient Active Problem List   Diagnosis Date Noted  . Encounter for family conference without patient present 05/01/2014  . Hypernatremia 05/01/2014  . Acute blood loss anemia 04/25/2014  . DVT (deep venous thrombosis) 03/21/2014  . Tremor 12/11/2013  . Speech abnormality 12/11/2013  . Bruises easily 11/29/2013  . Renal insufficiency 10/01/2013  . Dementia with behavioral disturbance 10/01/2013  . Atrial fibrillation 08/30/2013  . Cellulitis 07/30/2013  . Fever, unspecified 05/23/2013  . Cough 05/23/2013  . Edema 10/19/2012  . Depression 10/19/2012  . Glaucoma 10/19/2012  . Essential hypertension, benign 08/11/2012  . Osteoarthritis 08/11/2012  . Osteoporosis, unspecified 08/11/2012  . Hypopotassemia 08/11/2012   labs.   Gen. 27 2015.  WBC 13.5 hemoglobin 8.0 platelets 382.   04/29/1998 16.  Sodium 163 potassiu 94.1 BUN 38 creatinine 1.34  CBC    Component Value Date/Time   WBC 16.3* 03/26/2011 2338   RBC 3.95 03/26/2011 2338   HGB 11.7* 03/26/2011 2338   HCT 35.2* 03/26/2011 2338   PLT 244 03/26/2011 2338   MCV 89.1 03/26/2011 2338   LYMPHSABS 0.8 11/26/2009 1724   MONOABS 1.0 11/26/2009 1724   EOSABS 0.3 11/26/2009 1724   BASOSABS 0.0 11/26/2009 1724    CMP     Component Value Date/Time   NA 137 03/26/2011 2338   K 4.9 03/26/2011 2338   CL 104 03/26/2011 2338   CO2 22 03/26/2011 2338   GLUCOSE 98 03/26/2011 2338   BUN 61* 03/26/2011 2338    CREATININE 1.29* 03/26/2011 2338   CALCIUM 9.0 03/26/2011 2338   PROT 6.0 11/26/2009 1724   ALBUMIN 3.2* 11/26/2009 1724   AST 19 11/26/2009 1724   ALT 14 11/26/2009 1724   ALKPHOS 75 11/26/2009 1724   BILITOT 0.9 11/26/2009 1724   GFRNONAA 34* 03/26/2011 2338   GFRAA 40* 03/26/2011 2338    Assessment and Plan  Hypernatremia-- complicated with history of failure to thrive and end-stage dementia-as noted above both Dr. Lyn HollingsheadAlexander and I have spoken with her daughter this week - her daughter has reiterated desires for comfort care-I discussed the hospice consult and this has actually been implemented later today-- she was actually seen by the hospice nurse later today-also will not order any further labs or  IVs   at this point she does not appear to be uncomfortable although appears to be declining.  [id number]

## 2014-05-05 DIAGNOSIS — R627 Adult failure to thrive: Secondary | ICD-10-CM | POA: Insufficient documentation

## 2014-05-05 NOTE — Progress Notes (Signed)
Patient ID: Alexis HammingDorothy Archer, female   DOB: 23-Apr-1914, 81 y.o.   MRN: [id number]   This is an acute visit.  Level care skilled.  Facility [facility].  She complaint-acute visit follow-up hypernatremia.  History of present illness.  Patient is a 81 year old female who is quite familiar to rest-she recently has had a difficult course was increased bruising-this was thought secondary to her anticoagulant which has been Rob BuntingXarelto--she does have a history of atrial fibrillation complicated with a recent DVT in her leg.  Patient developed a large hematoma over left axilla area this appears to be slowly resolving her anticoagulant has been discontinued.  However she is not eating and drinking very well here sodium has been significantly elevated most recently 163-family wishes conservative care and we have given her IV fluids nonetheless her sodium today continues to be 163 despite twice a day therapy.  Clinically she appears to be relatively at her baseline she is alert and responsive and agitated with exam.  Family medical social history as been reviewed.  Medications have been reviewed per MAR.  Review of systems unattainable secondary to dementia please see history of present illness she essentially remains have her baseline.  Physical exam.  Vital signs have been somewhat difficult to obtain secondary to agitation pulse is 60 respirations are 16 last noted she was afebrile and blood pressures were stable.  General this is a frail elderly female in no distress sitting comfortably in her wheelchair.  Her skin is warm and dry the hematoma in her left axilla area appears to be resolving with reduced size has a brawny appearance she has chronic bruising of her lower arms and legs which appear unchanged.  Her chest is clear to auscultation with poor effort.  Heart exam was difficult secondary to agitation but continues to be irregular irregular.  Her abdomen is soft does not appear to be  acutely tender there are positive bowel sounds.  Muscle skeletal is able to move all her extremities 4 with baseline strength.  Neurologic she has significant dementia again is agitated with exam.  Labs.  04/29/2014.  Sodium 163 potassium 4.1 BUN 38 creatinine 1.34.  Assessment and plan.  Hypernatremia-again this is a difficult situation patient appears to be entering the end stages of dementia-this has been discussed with her daughter I also discussed this today with Dr. Lyn HollingsheadAlexander via phone we will attempt to give her 1 more liter of normal saline at 100 mL an hour and follow up with Dr. Lyn HollingsheadAlexander tomorrow-clinically she does not appear to be unstable.  She also has a significant history of anemia which also prompted discontinuation of her anticoagulation her hemoglobin appears to be trending up now above 7 and update CBC is pending  [id number]

## 2014-05-16 ENCOUNTER — Non-Acute Institutional Stay (SKILLED_NURSING_FACILITY): Payer: Medicare Other | Admitting: Internal Medicine

## 2014-05-16 DIAGNOSIS — R609 Edema, unspecified: Secondary | ICD-10-CM

## 2014-05-16 DIAGNOSIS — I482 Chronic atrial fibrillation, unspecified: Secondary | ICD-10-CM

## 2014-05-16 DIAGNOSIS — R233 Spontaneous ecchymoses: Secondary | ICD-10-CM

## 2014-05-16 DIAGNOSIS — R238 Other skin changes: Secondary | ICD-10-CM

## 2014-05-16 DIAGNOSIS — R627 Adult failure to thrive: Secondary | ICD-10-CM

## 2014-05-16 NOTE — Progress Notes (Signed)
Patient ID: Alexis Archer, female   DOB: 07-07-1914, 81 y.o.   MRN: [id number]   this is an acute visit.  Level care skilled.  Facility [facility]  Patient presents with  . Acute Visit --secondary to right arm edema     HPI: Patient is 81 y.o. female who has been declining recently- she has a history of end-stage dementia and recently has not been eating or drinking much at all her sodium has risen to 163- this has remained at that level despite receiving IV fluids-Dr. Lyn Hollingshead did speak with her daughter earlier this week  And daughter as indicated she would prefer comfort care--so there been no further labs staff has been encouraging fluids and by mouth intake-apparently she has a sporadic appetite but clinically appears to be comfortable  She is off anticoagulation despite a history of her recent left leg DVT and atrial fibrillation-she had significant bruising and a large hematoma which appears to have resolved-it was decided the risk of bleeding was greater than the benefit of continuing anticoagulation.  Her daughter is asking to look at some right arm edema-talking with nursing apparently this is intermittent and appears to be dependent related-the edema is essentially on her lower right arm it is cool to touch non-erythematous does not appear to be tender or firm-this does appear to be more fluid related.  Radial pulse is intact.  She also has some mildly increased edema of her left leg again she is off Lasix as well as her anticoagulation  She is again under comfort care and followed by hospice services .      Past Medical History  Diagnosis Date  . Hypertension   . Glaucoma   . GERD (gastroesophageal reflux disease)   . Anemia   . Osteoarthritis     No past surgical history on file.                   acetaminophen 500 MG tablet  Commonly known as:  TYLENOL  Take 500 mg by mouth 3 (three) times daily.     calcitonin (salmon) 200 UNIT/ACT nasal spray    Commonly known as:  MIACALCIN/FORTICAL  Place 1 spray into the nose daily. Alternates nostrils every day     calcium-vitamin D 500-200 MG-UNIT per tablet  Commonly known as:  OSCAL WITH D  Take 1 tablet by mouth 3 (three) times daily.     COMBIGAN 0.2-0.5 % ophthalmic solution  Generic drug:  brimonidine-timolol  Place 1 drop into both eyes every 12 (twelve) hours.     diclofenac sodium 1 % Gel  Commonly known as:  VOLTAREN  Apply 2 g topically 4 (four) times daily. Apply to  knees 4 times a day as needed for pain     divalproex 125 MG DR tablet  Commonly known as:  DEPAKOTE  Take 250 mg by mouth 3 (three) times daily.     latanoprost 0.005 % ophthalmic solution  Commonly known as:  XALATAN  Place 1 drop into both eyes at bedtime.     LORazepam 1 MG tablet  Commonly known as:  ATIVAN  1 tablet by mouth prior to dental procedure on 01/04/13 as directed by staff    Norco 08/06/2023 milligrams every 6 hours when necessary pain  was started by hospice     No orders of the defined types were placed in this encounter.    Immunization History  Administered Date(s) Administered  . Influenza Whole 01/14/2013  . Influenza-Unspecified 01/15/2014  History  Substance Use Topics  . Smoking status: Never Smoker   . Smokeless tobacco: Not on file  . Alcohol Use: No    Review of Systems  DATA OBTAINED: from nurse,  GENERAL:  no fevers, fatigue+ appetite changes SKIN: No itching, rash; has chronic bruising HEENT: No complaint RESPIRATORY: No cough, wheezing, SOB CARDIAC: No chest pain, palpitations some increased right lower arm edema and left  lower extremity edema  GI: No abdominal pain, No N/V/D or constipation, No heartburn or reflux  GU: No dysuria, frequency or urgency, or incontinence  MUSCULOSKELETAL: No unrelieved bone/joint pain NEUROLOGIC: No headache, or focal weakness  PSYCHIATRIC: dementia                      Physical Exam  She is afebrile pulse  on exam 66 respirations 16 last note a blood pressure?? 60/36 ??this is difficult to obtain secondary to patient agitation GENERAL APPEARANCE: Alert,non conversant, No acute distress Sitting it appears comfortably in the wheelchair SKIN:  Is warm and dry-has chronic bruising which is diffuse but  does not appear grossly increased HEENT: Unremarkable RESPIRATORY: Breathing is even, unlabored. Lung sounds are clear   CARDIOVASCULAR: Heart RRR no murmurs, rubs or gallops. Has some mildly increased left lower extremity edema this is not significantly tender or erythematous GASTROINTESTINAL: Abdomen is soft, non-tender, not distended w/ normal bowel sounds.  GENITOURINARY: Bladder non tender, not distended  MUSCULOSKELETAL: No abnormal joints or musculature--she does have some increased edema of her right lower arm again this is cool to touch nonerythematous nontender appears to be fluid related NEUROLOGIC: Cranial nerves 2-12 grossly intact. PSYCHIATRIC: dementia, doesn't like to be touched  Patient Active Problem List   Diagnosis Date Noted  . Encounter for family conference without patient present 05/01/2014  . Hypernatremia 05/01/2014  . Acute blood loss anemia 04/25/2014  . DVT (deep venous thrombosis) 03/21/2014  . Tremor 12/11/2013  . Speech abnormality 12/11/2013  . Bruises easily 11/29/2013  . Renal insufficiency 10/01/2013  . Dementia with behavioral disturbance 10/01/2013  . Atrial fibrillation 08/30/2013  . Cellulitis 07/30/2013  . Fever, unspecified 05/23/2013  . Cough 05/23/2013  . Edema 10/19/2012  . Depression 10/19/2012  . Glaucoma 10/19/2012  . Essential hypertension, benign 08/11/2012  . Osteoarthritis 08/11/2012  . Osteoporosis, unspecified 08/11/2012  . Hypopotassemia 08/11/2012   labs.   Jan. 27 2015.  WBC 13.5 hemoglobin 8.0 platelets 382.   04/29/1998 16.  Sodium 163 potassiu 94.1 BUN 38 creatinine 1.34  CBC    Component Value Date/Time   WBC 16.3*  03/26/2011 2338   RBC 3.95 03/26/2011 2338   HGB 11.7* 03/26/2011 2338   HCT 35.2* 03/26/2011 2338   PLT 244 03/26/2011 2338   MCV 89.1 03/26/2011 2338   LYMPHSABS 0.8 11/26/2009 1724   MONOABS 1.0 11/26/2009 1724   EOSABS 0.3 11/26/2009 1724   BASOSABS 0.0 11/26/2009 1724    CMP     Component Value Date/Time   NA 137 03/26/2011 2338   K 4.9 03/26/2011 2338   CL 104 03/26/2011 2338   CO2 22 03/26/2011 2338   GLUCOSE 98 03/26/2011 2338   BUN 61* 03/26/2011 2338   CREATININE 1.29* 03/26/2011 2338   CALCIUM 9.0 03/26/2011 2338   PROT 6.0 11/26/2009 1724   ALBUMIN 3.2* 11/26/2009 1724   AST 19 11/26/2009 1724   ALT 14 11/26/2009 1724   ALKPHOS 75 11/26/2009 1724   BILITOT 0.9 11/26/2009 1724   GFRNONAA 34* 03/26/2011  2338   GFRAA 40* 03/26/2011 2338    Assessment and Plan  Ar  Right arm edema-this does appear to be dependent related have spoken with staff about encouraging elevation here-monitor for any changes--this was discussed with her daughter at bedside  Failure to thrive with history of end-stage dementia hypernatremia-anemia-again she is under comfort care and followed by hospice services they have started Norco when necessary for pain management,-currently she appears to be comfortable continue to monitor  [phone]

## 2014-06-04 DEATH — deceased
